# Patient Record
Sex: Male | Born: 1965
Health system: Southern US, Community
[De-identification: ages and names within clinical notes are randomized; demographics above are authoritative.]

## PROBLEM LIST (undated history)

## (undated) DIAGNOSIS — I890 Lymphedema, not elsewhere classified: Secondary | ICD-10-CM

## (undated) DIAGNOSIS — E039 Hypothyroidism, unspecified: Secondary | ICD-10-CM

## (undated) DIAGNOSIS — E079 Disorder of thyroid, unspecified: Secondary | ICD-10-CM

## (undated) HISTORY — DX: Disorder of thyroid, unspecified: E07.9

## (undated) HISTORY — PX: HERNIA REPAIR: SHX51

## (undated) HISTORY — PX: CHOLECYSTECTOMY, LAPAROSCOPIC: SHX56

## (undated) HISTORY — PX: GASTRIC BYPASS: SHX52

## (undated) HISTORY — DX: Morbid (severe) obesity due to excess calories: E66.01

## (undated) HISTORY — DX: Lymphedema, not elsewhere classified: I89.0

## (undated) HISTORY — DX: Hypothyroidism, unspecified: E03.9

---

## 2004-05-05 ENCOUNTER — Ambulatory Visit: Payer: Self-pay | Admitting: Specialist

## 2007-11-16 ENCOUNTER — Ambulatory Visit: Payer: Self-pay | Admitting: Internal Medicine

## 2008-11-12 ENCOUNTER — Ambulatory Visit: Payer: Self-pay | Admitting: Internal Medicine

## 2008-11-19 ENCOUNTER — Ambulatory Visit: Payer: Self-pay | Admitting: Internal Medicine

## 2008-11-28 ENCOUNTER — Ambulatory Visit: Payer: Self-pay | Admitting: Internal Medicine

## 2008-12-03 ENCOUNTER — Ambulatory Visit: Payer: Self-pay | Admitting: Internal Medicine

## 2008-12-05 ENCOUNTER — Ambulatory Visit: Payer: Self-pay | Admitting: Internal Medicine

## 2008-12-10 ENCOUNTER — Ambulatory Visit: Payer: Self-pay | Admitting: Internal Medicine

## 2008-12-12 ENCOUNTER — Ambulatory Visit: Payer: Self-pay | Admitting: Internal Medicine

## 2008-12-17 ENCOUNTER — Ambulatory Visit: Payer: Self-pay | Admitting: Internal Medicine

## 2008-12-19 ENCOUNTER — Ambulatory Visit: Payer: Self-pay | Admitting: Internal Medicine

## 2008-12-23 ENCOUNTER — Ambulatory Visit: Payer: Self-pay | Admitting: Internal Medicine

## 2008-12-26 ENCOUNTER — Ambulatory Visit: Payer: Self-pay | Admitting: Internal Medicine

## 2008-12-30 ENCOUNTER — Encounter: Payer: Self-pay | Admitting: Internal Medicine

## 2009-01-26 ENCOUNTER — Encounter: Payer: Self-pay | Admitting: Internal Medicine

## 2009-02-25 ENCOUNTER — Encounter: Payer: Self-pay | Admitting: Internal Medicine

## 2009-03-28 ENCOUNTER — Encounter: Payer: Self-pay | Admitting: Internal Medicine

## 2009-08-01 IMAGING — CT CT PARANASAL SINUSES W/O CM
1 series · 16 of 30 positions shown, 20 images · non-contrast
Comparison: none

REASON FOR EXAM: right facial pain    pain behind right eye   ear ache
COMMENTS:

PROCEDURE:     CT  - CT SINUSES WITHOUT CONTRAST  - November 16, 2007  [DATE]
RESULT:     CT of the sinuses
INDICATION: Right facial pain
Comparisons: No comparison

[Series 3: sinus · axial · 0.39mm/px · z∈[+462,+614]mm · 16 of 36 slices shown, 20 images]
[im 2/36  brain]
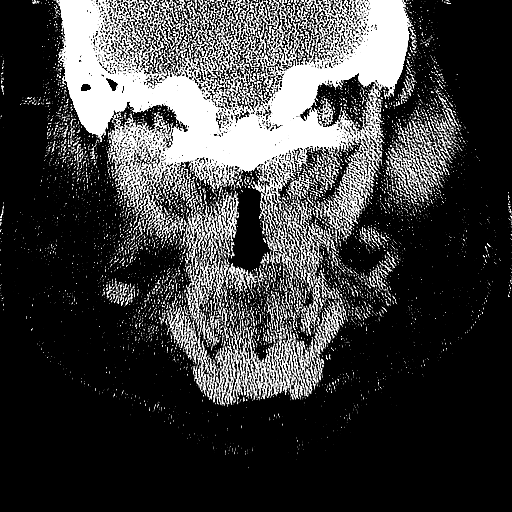
[im 2/36  bone]
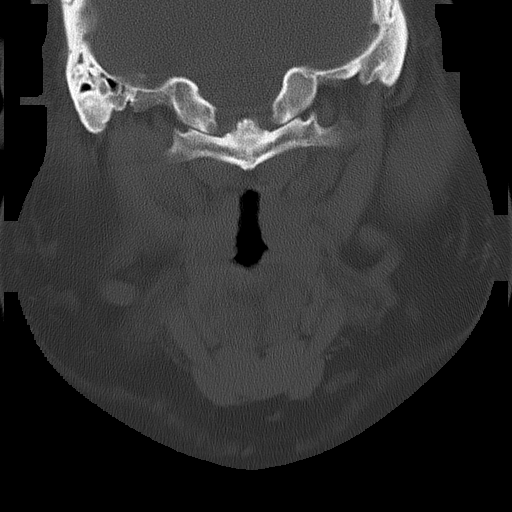
[im 4/36  bone]
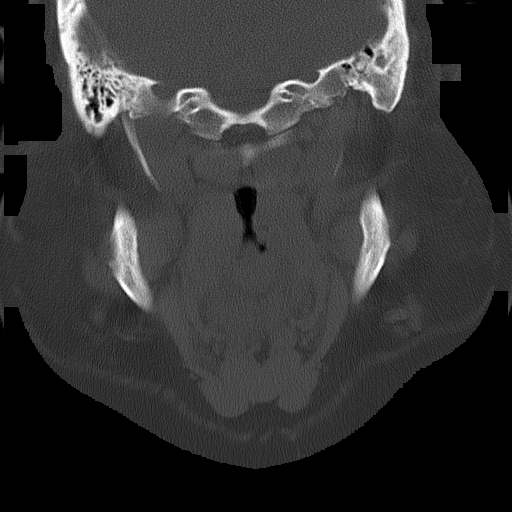
[im 7/36  bone]
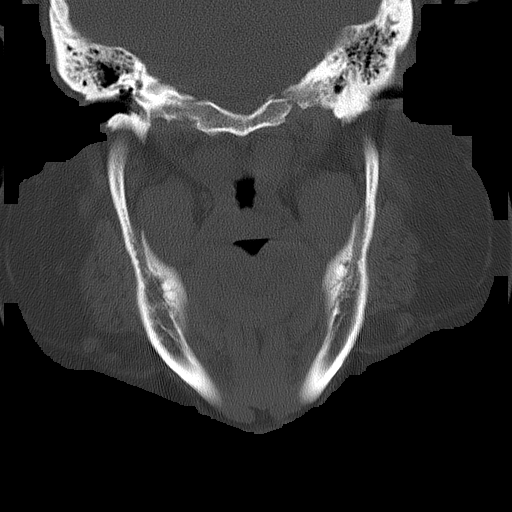
[im 9/36  bone]
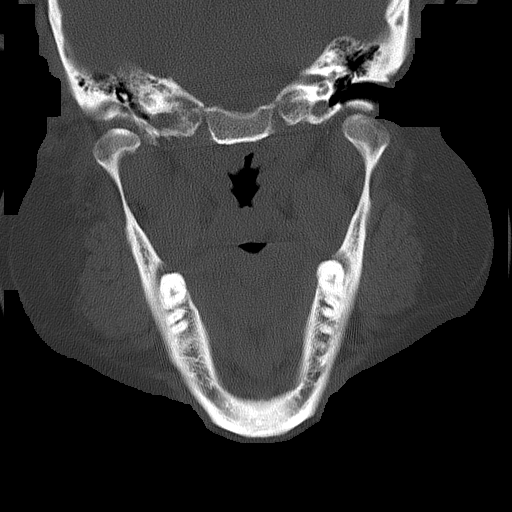
[im 10/36  brain]
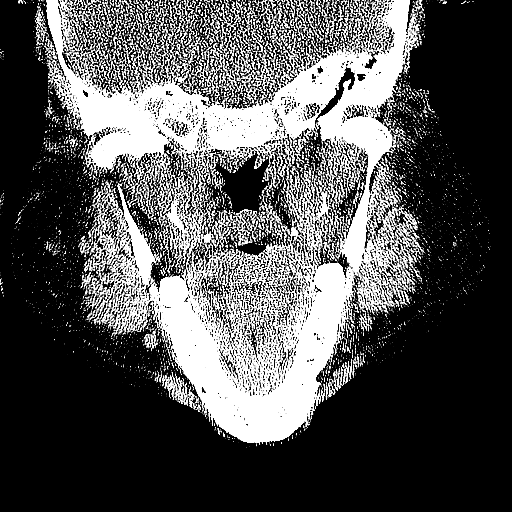
[im 10/36  bone]
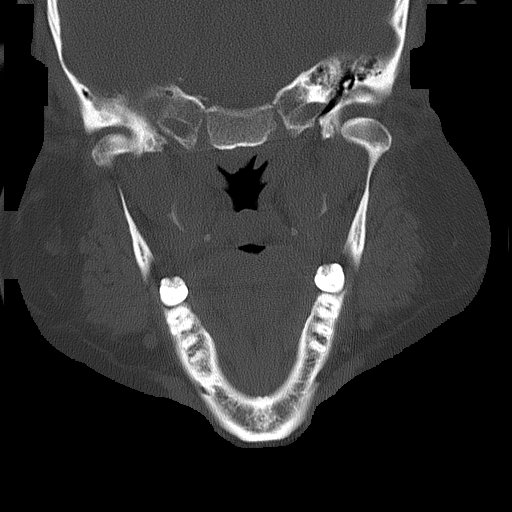
[im 13/36  bone]
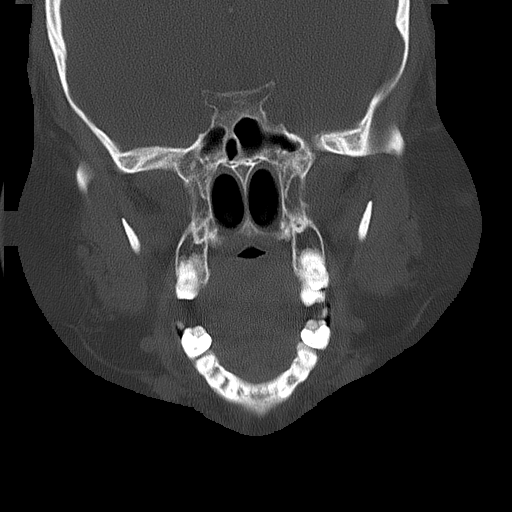
[im 15/36  bone]
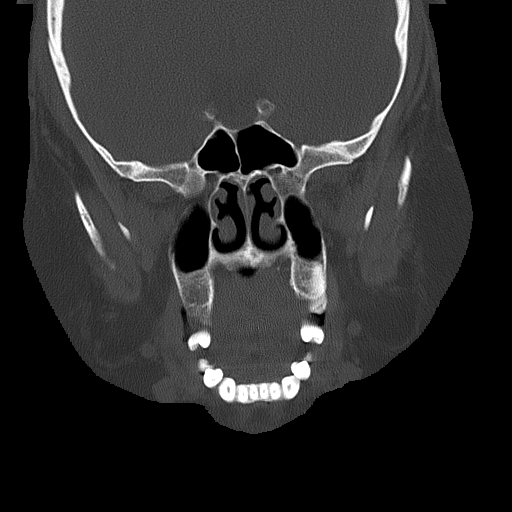
[im 17/36  bone]
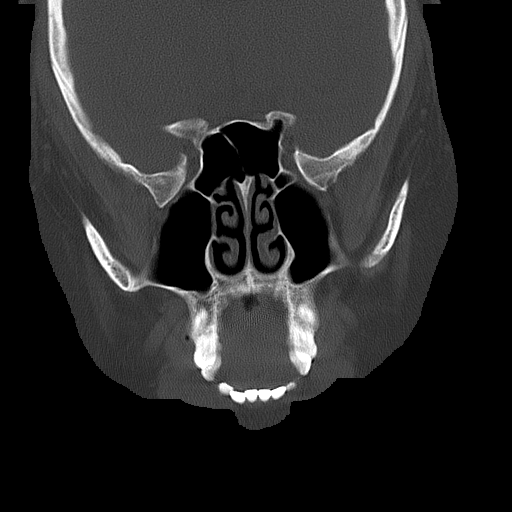
[im 19/36  brain]
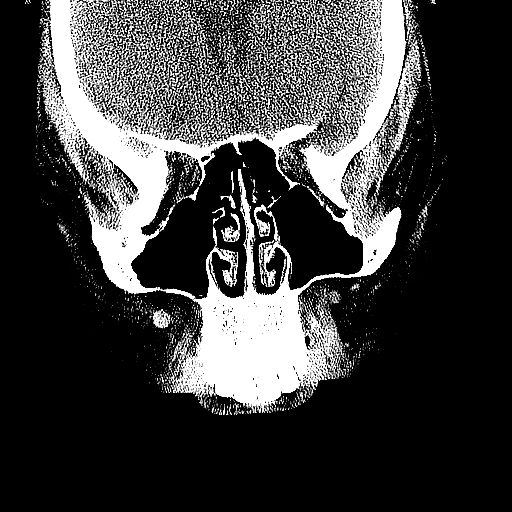
[im 19/36  bone]
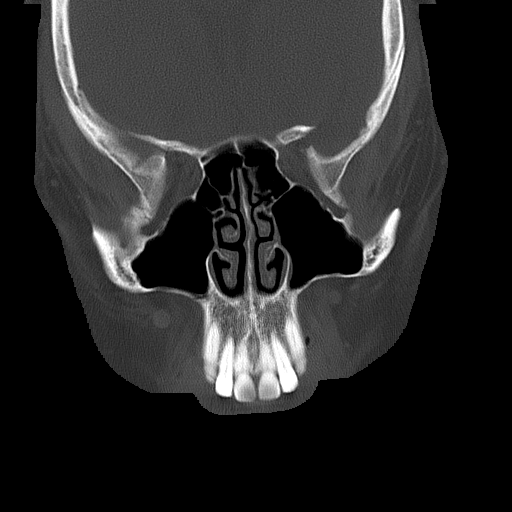
[im 21/36  bone]
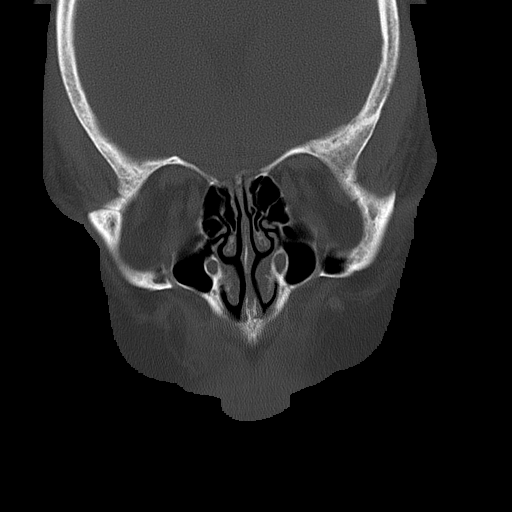
[im 23/36  bone]
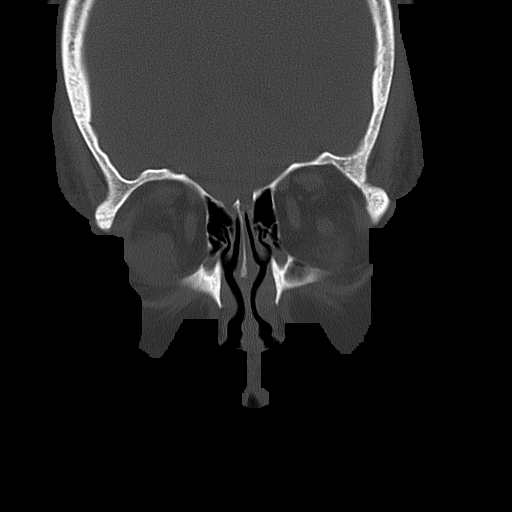
[im 26/36  bone]
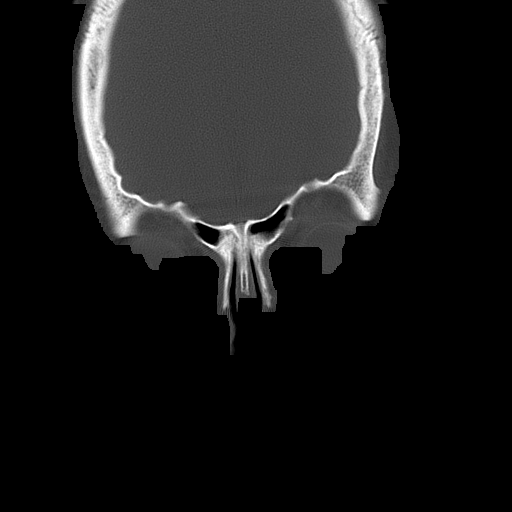
[im 27/36  brain]
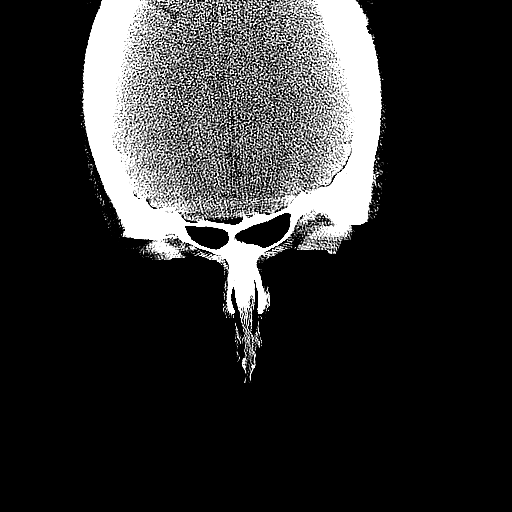
[im 27/36  bone]
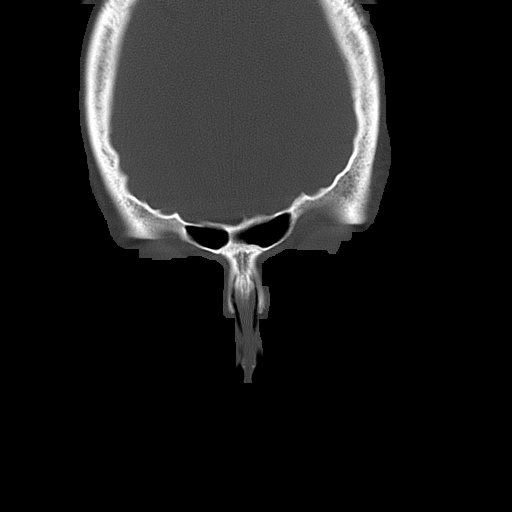
[im 29/36  bone]
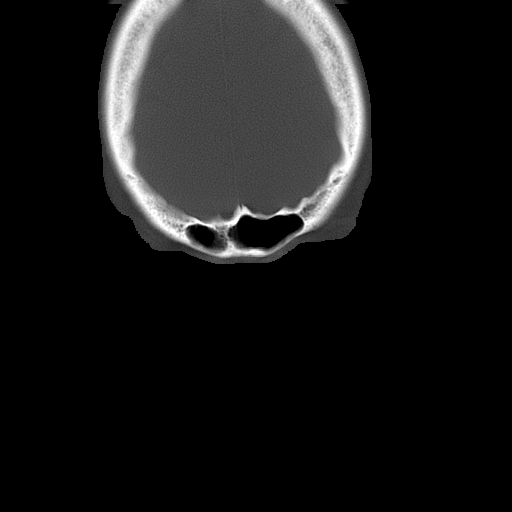
[im 32/36  bone]
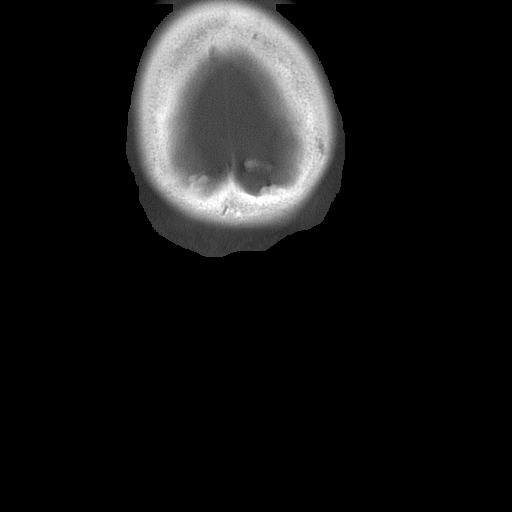
[im 34/36  bone]
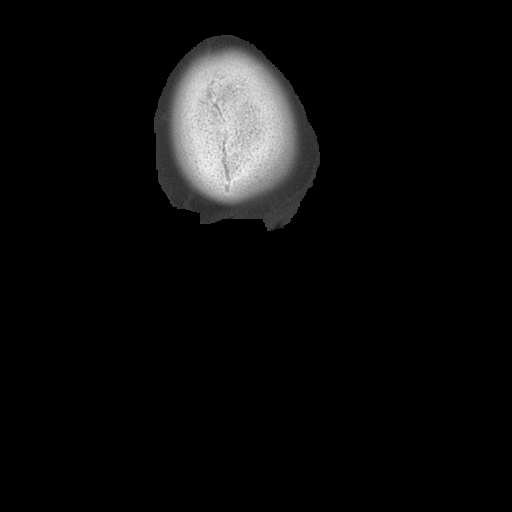

[16 of 30 positions shown; findings below may reference images not displayed]

FINDINGS: The paranasal sinuses including the sphenoidal, ethmoidal, frontal, and
maxillary sinuses, are identified without evidence of disease. No air-fluid
levels are identified. The nasal septum is midline. The OMCs are patent
bilaterally. The frontoethmoid recesses are normal bilaterally. No bony
abnormalities are identified. The visualized portions of the brain and
orbits are unremarkable.
IMPRESSION: No evidence for acute sinus abnormality

## 2012-03-09 ENCOUNTER — Ambulatory Visit: Payer: Self-pay

## 2012-04-09 ENCOUNTER — Ambulatory Visit: Payer: Self-pay | Admitting: Internal Medicine

## 2012-04-10 ENCOUNTER — Ambulatory Visit: Payer: Self-pay | Admitting: Specialist

## 2012-04-10 LAB — CBC WITH DIFFERENTIAL/PLATELET
Basophil #: 0.1 10*3/uL (ref 0.0–0.1)
Basophil %: 1.3 %
Eosinophil %: 4.2 %
HGB: 14.2 g/dL (ref 13.0–18.0)
Lymphocyte %: 31.8 %
MCH: 29.2 pg (ref 26.0–34.0)
MCV: 87 fL (ref 80–100)
Monocyte %: 9.4 %
Neutrophil %: 53.3 %
Platelet: 249 10*3/uL (ref 150–440)
RBC: 4.87 10*6/uL (ref 4.40–5.90)
RDW: 14.5 % (ref 11.5–14.5)

## 2012-04-10 LAB — FOLATE: Folic Acid: 13.2 ng/mL (ref 3.1–100.0)

## 2012-04-10 LAB — COMPREHENSIVE METABOLIC PANEL
Anion Gap: 7 (ref 7–16)
BUN: 22 mg/dL — ABNORMAL HIGH (ref 7–18)
Bilirubin,Total: 0.6 mg/dL (ref 0.2–1.0)
Calcium, Total: 8.9 mg/dL (ref 8.5–10.1)
Co2: 24 mmol/L (ref 21–32)
Creatinine: 1.05 mg/dL (ref 0.60–1.30)
EGFR (Non-African Amer.): >60
Glucose: 91 mg/dL (ref 65–99)
Osmolality: 271 (ref 275–301)
SGOT(AST): 44 U/L — ABNORMAL HIGH (ref 15–37)
SGPT (ALT): 58 U/L (ref 12–78)
Sodium: 134 mmol/L — ABNORMAL LOW (ref 136–145)

## 2012-04-10 LAB — MAGNESIUM: Magnesium: 2 mg/dL

## 2012-04-10 LAB — BILIRUBIN, DIRECT: Bilirubin, Direct: 0.2 mg/dL (ref 0.00–0.20)

## 2012-04-10 LAB — HEMOGLOBIN A1C: Hemoglobin A1C: 5.7 % (ref 4.2–6.3)

## 2012-04-10 LAB — LIPASE, BLOOD: Lipase: 154 U/L (ref 73–393)

## 2012-04-10 LAB — PROTIME-INR: Prothrombin Time: 13.6 secs (ref 11.5–14.7)

## 2012-04-10 LAB — AMYLASE: Amylase: 35 U/L (ref 25–115)

## 2012-04-10 LAB — IRON AND TIBC: Iron Bind.Cap.(Total): 318 ug/dL (ref 250–450)

## 2012-04-10 LAB — PHOSPHORUS: Phosphorus: 2.8 mg/dL (ref 2.5–4.9)

## 2012-05-02 ENCOUNTER — Ambulatory Visit: Payer: Self-pay | Admitting: Specialist

## 2012-05-26 ENCOUNTER — Ambulatory Visit: Payer: Self-pay | Admitting: Specialist

## 2012-11-12 ENCOUNTER — Ambulatory Visit: Payer: Self-pay | Admitting: Specialist

## 2012-11-14 ENCOUNTER — Ambulatory Visit: Payer: Self-pay | Admitting: Specialist

## 2013-05-13 IMAGING — US US EXTREM LOW VENOUS*R*
1 series · 14 of 24 positions shown · non-contrast
Comparison: none

REASON FOR EXAM: CALL REPORT [id number] Rt Leg Pain Swelling Eval DVT
COMMENTS:

PROCEDURE:     US  - US DOPPLER LOW EXTR RIGHT  - April 09, 2012 [DATE]
RESULT:     Comparison: None

[Series 1: us extrem low venous*right* · 0.18mm/px · 14 of 32 slices shown]
[im 1/32]
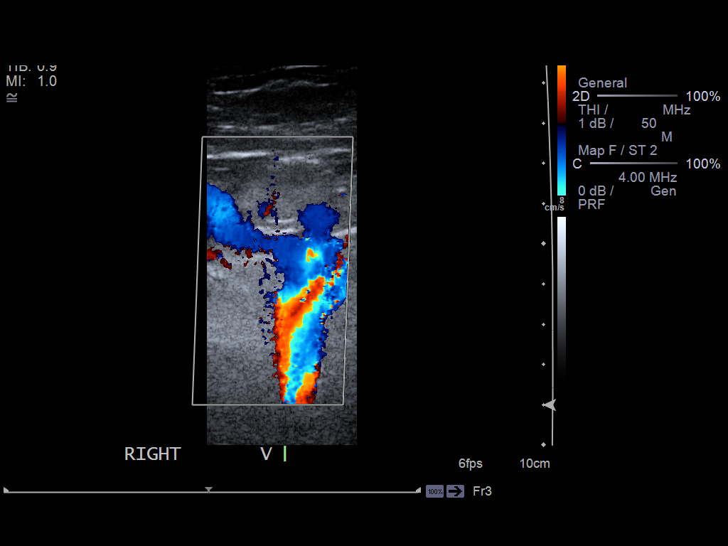
[im 3/32]
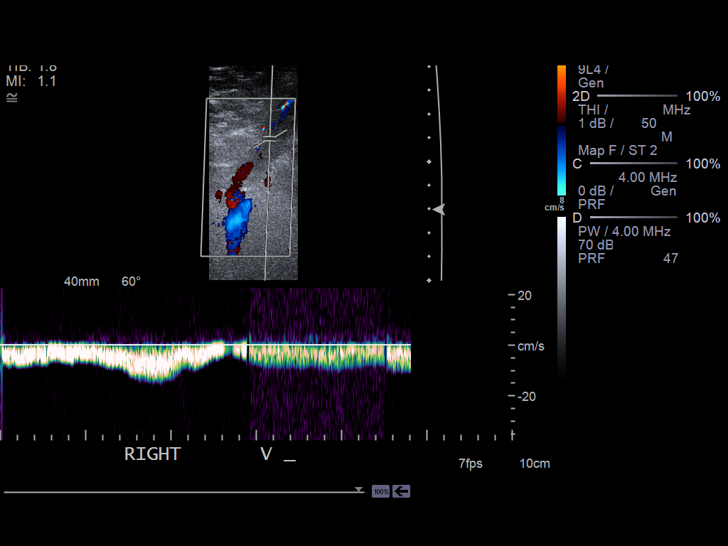
[im 6/32]
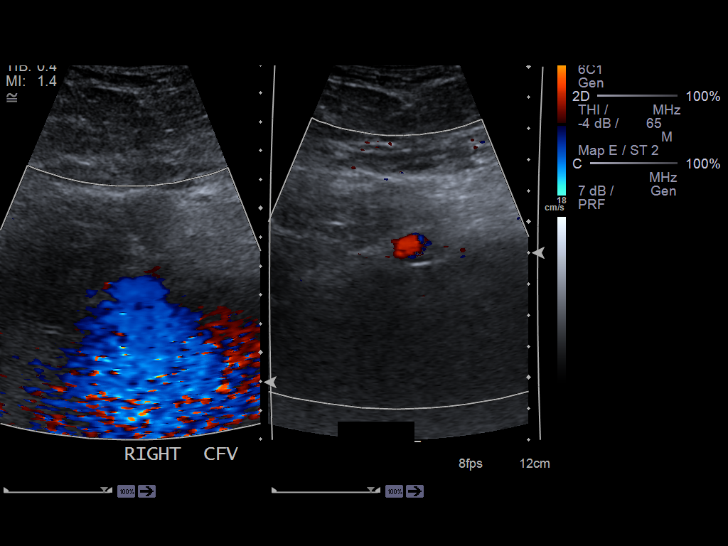
[im 9/32]
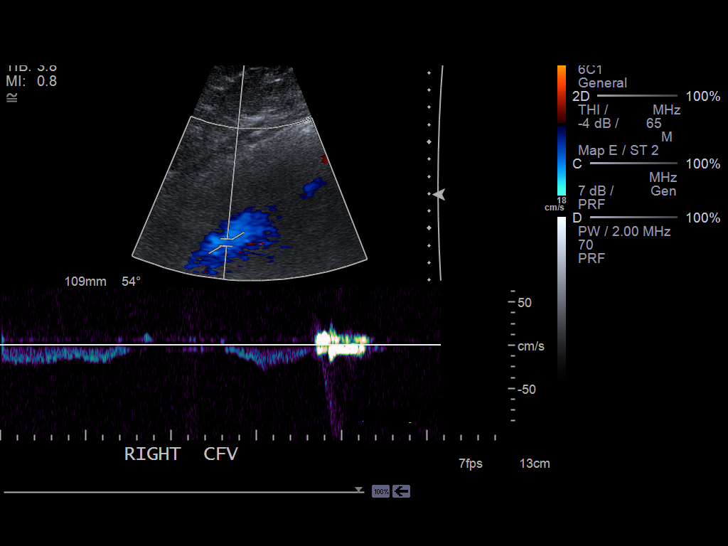
[im 10/32]
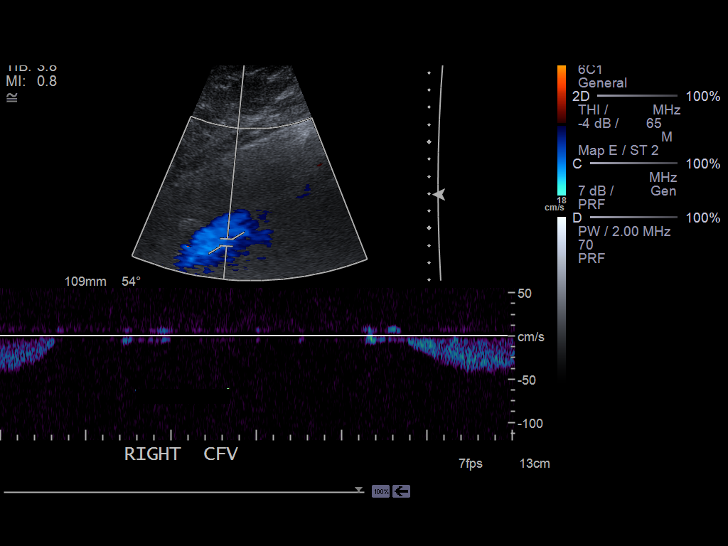
[im 13/32]
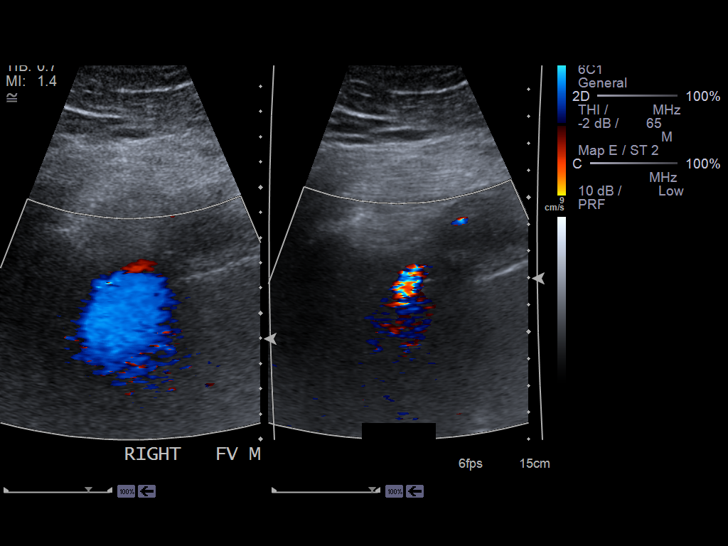
[im 15/32]
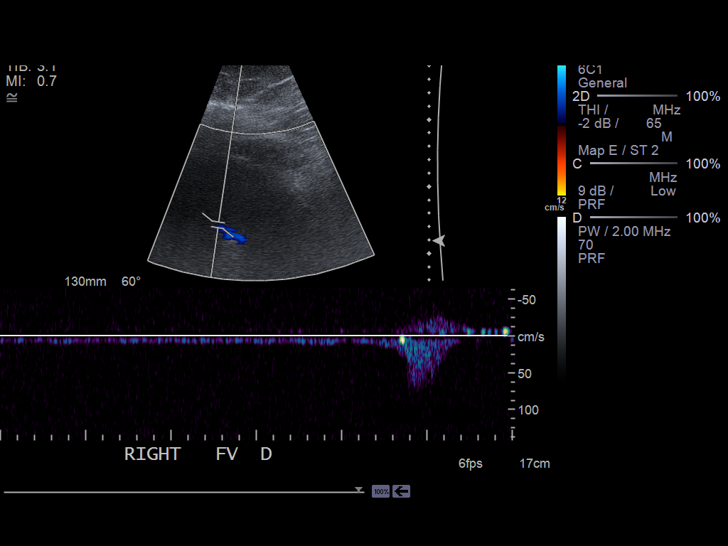
[im 17/32]
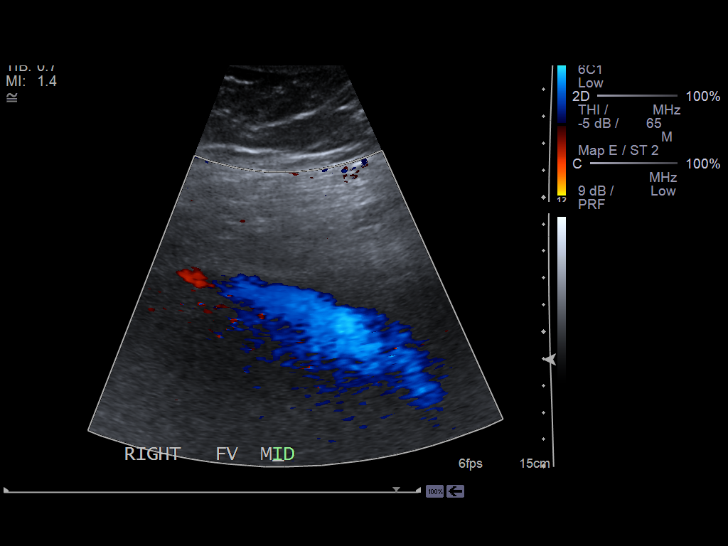
[im 19/32]
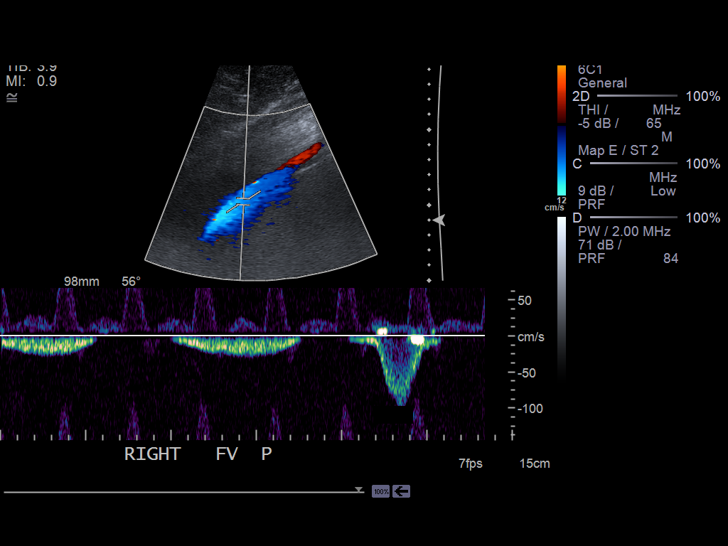
[im 22/32]
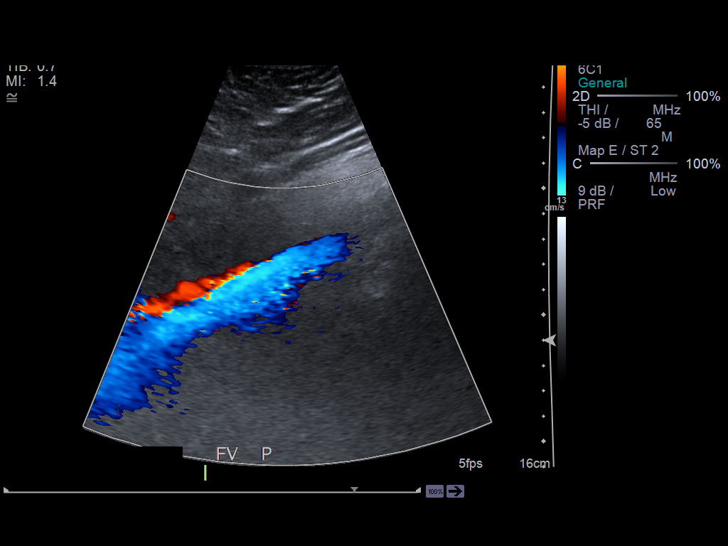
[im 25/32]
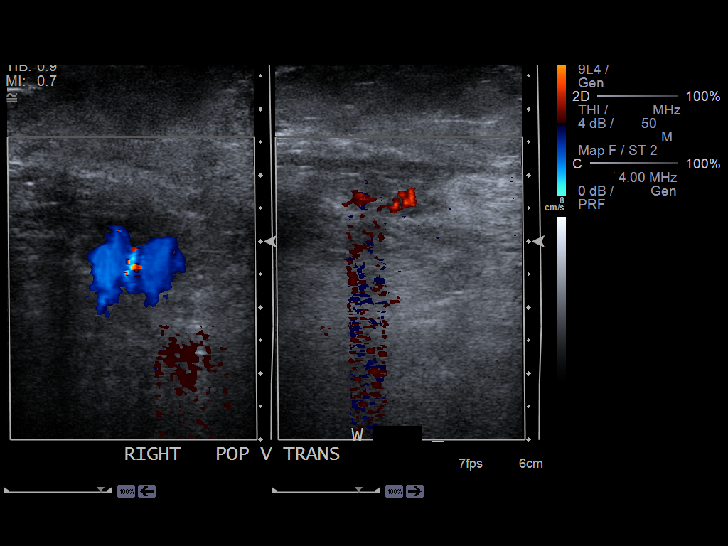
[im 26/32]
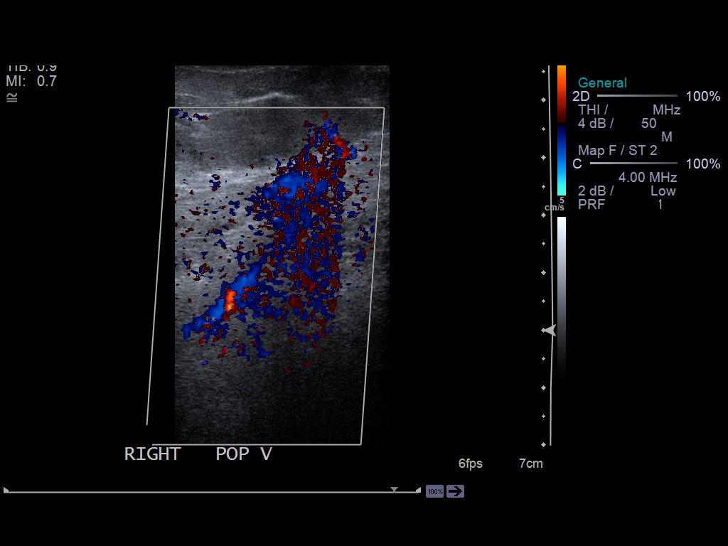
[im 29/32]
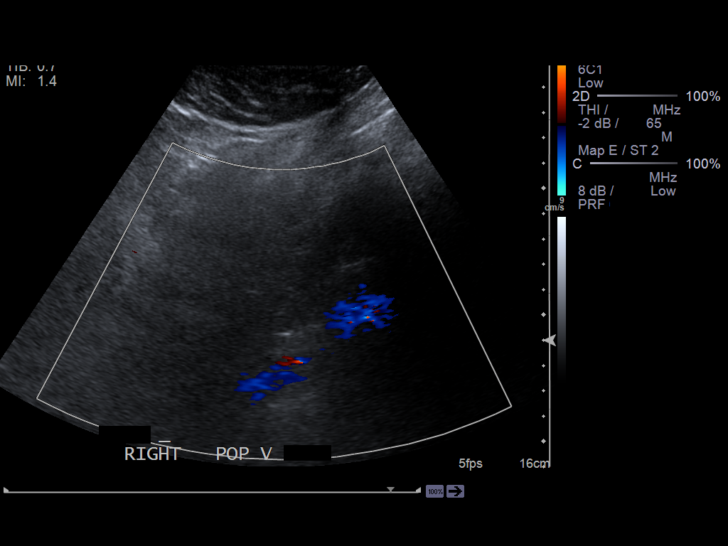
[im 32/32]
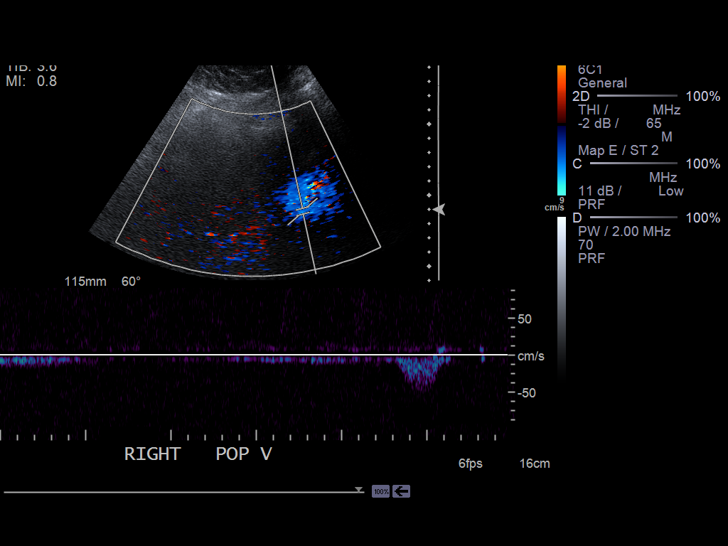

[14 of 24 positions shown; findings below may reference images not displayed]

FINDINGS: Multiple longitudinal and transverse gray-scale as well as color
and spectral Doppler images of the right lower extremity veins were obtained
from the common femoral veins through the popliteal veins. Examination is
limited secondary to patient body habitus and edema.

The right common femoral, greater saphenous, femoral, popliteal veins, and
venous trifurcation are patent, demonstrating normal color-flow and
compressibility. No intraluminal thrombus is identified.There is normal
respiratory variation and augmentation demonstrated at all vein levels.
IMPRESSION: Examination is limited secondary to patient body habitus and edema. No
evidence of DVT in the right lower extremity.

[REDACTED]

## 2013-11-18 ENCOUNTER — Other Ambulatory Visit: Payer: Self-pay | Admitting: Specialist

## 2013-11-18 LAB — COMPREHENSIVE METABOLIC PANEL
ANION GAP: 8 (ref 7–16)
AST: 37 U/L (ref 15–37)
Albumin: 3.7 g/dL (ref 3.4–5.0)
Alkaline Phosphatase: 52 U/L
BILIRUBIN TOTAL: 0.6 mg/dL (ref 0.2–1.0)
BUN: 26 mg/dL — AB (ref 7–18)
CALCIUM: 8.4 mg/dL — AB (ref 8.5–10.1)
CO2: 26 mmol/L (ref 21–32)
CREATININE: 0.79 mg/dL (ref 0.60–1.30)
Chloride: 108 mmol/L — ABNORMAL HIGH (ref 98–107)
EGFR (African American): >60
Glucose: 87 mg/dL (ref 65–99)
OSMOLALITY: 287 (ref 275–301)
Potassium: 4.2 mmol/L (ref 3.5–5.1)
SGPT (ALT): 37 U/L
SODIUM: 142 mmol/L (ref 136–145)
TOTAL PROTEIN: 7 g/dL (ref 6.4–8.2)

## 2013-11-18 LAB — CBC WITH DIFFERENTIAL/PLATELET
BASOS PCT: 0.9 %
Basophil #: 0.1 10*3/uL (ref 0.0–0.1)
EOS PCT: 2.8 %
Eosinophil #: 0.2 10*3/uL (ref 0.0–0.7)
HCT: 45.9 % (ref 40.0–52.0)
HGB: 15.2 g/dL (ref 13.0–18.0)
LYMPHS ABS: 2.3 10*3/uL (ref 1.0–3.6)
Lymphocyte %: 37 %
MCH: 30.4 pg (ref 26.0–34.0)
MCHC: 33.1 g/dL (ref 32.0–36.0)
MCV: 92 fL (ref 80–100)
MONOS PCT: 9.1 %
Monocyte #: 0.6 x10 3/mm (ref 0.2–1.0)
NEUTROS ABS: 3.1 10*3/uL (ref 1.4–6.5)
Neutrophil %: 50.2 %
Platelet: 200 10*3/uL (ref 150–440)
RBC: 5 10*6/uL (ref 4.40–5.90)
RDW: 13.3 % (ref 11.5–14.5)
WBC: 6.2 10*3/uL (ref 3.8–10.6)

## 2013-11-18 LAB — PHOSPHORUS: Phosphorus: 3.4 mg/dL (ref 2.5–4.9)

## 2013-11-18 LAB — MAGNESIUM: Magnesium: 2 mg/dL

## 2013-11-18 LAB — IRON: Iron: 66 ug/dL (ref 65–175)

## 2013-11-18 LAB — AMYLASE: AMYLASE: 50 U/L (ref 25–115)

## 2013-11-18 LAB — FERRITIN: Ferritin (ARMC): 354 ng/mL (ref 8–388)

## 2013-11-18 LAB — FOLATE: FOLIC ACID: 38.9 ng/mL (ref 3.1–100.0)

## 2013-11-23 IMAGING — CR DG KNEE COMPLETE 4+V*R*
1 series · 4 of 4 positions shown · non-contrast
Comparison: none

REASON FOR EXAM: knee pain
COMMENTS:

PROCEDURE:     DXR - DXR KNEE RT COMP WITH OBLIQUES  - March 09, 2012  [DATE]
RESULT:     Patellofemoral/ femoral-tibial degenerative change. No acute
abnormalities identified.

[Series 1: t knee obl right · 0.14mm/px · 4 of 4 slices shown]
[im 1/4]
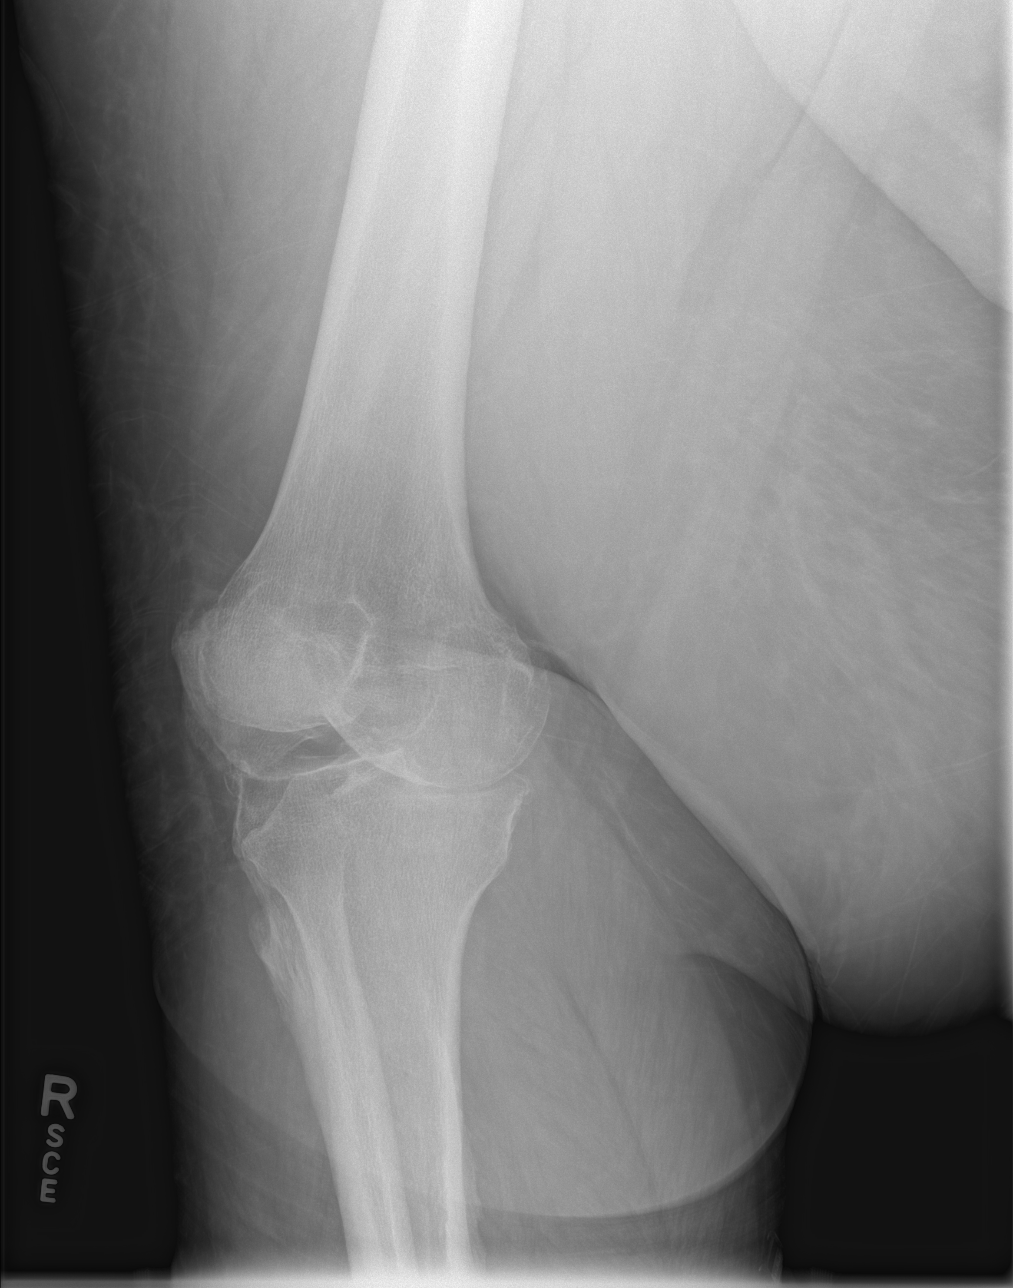
[im 2/4]
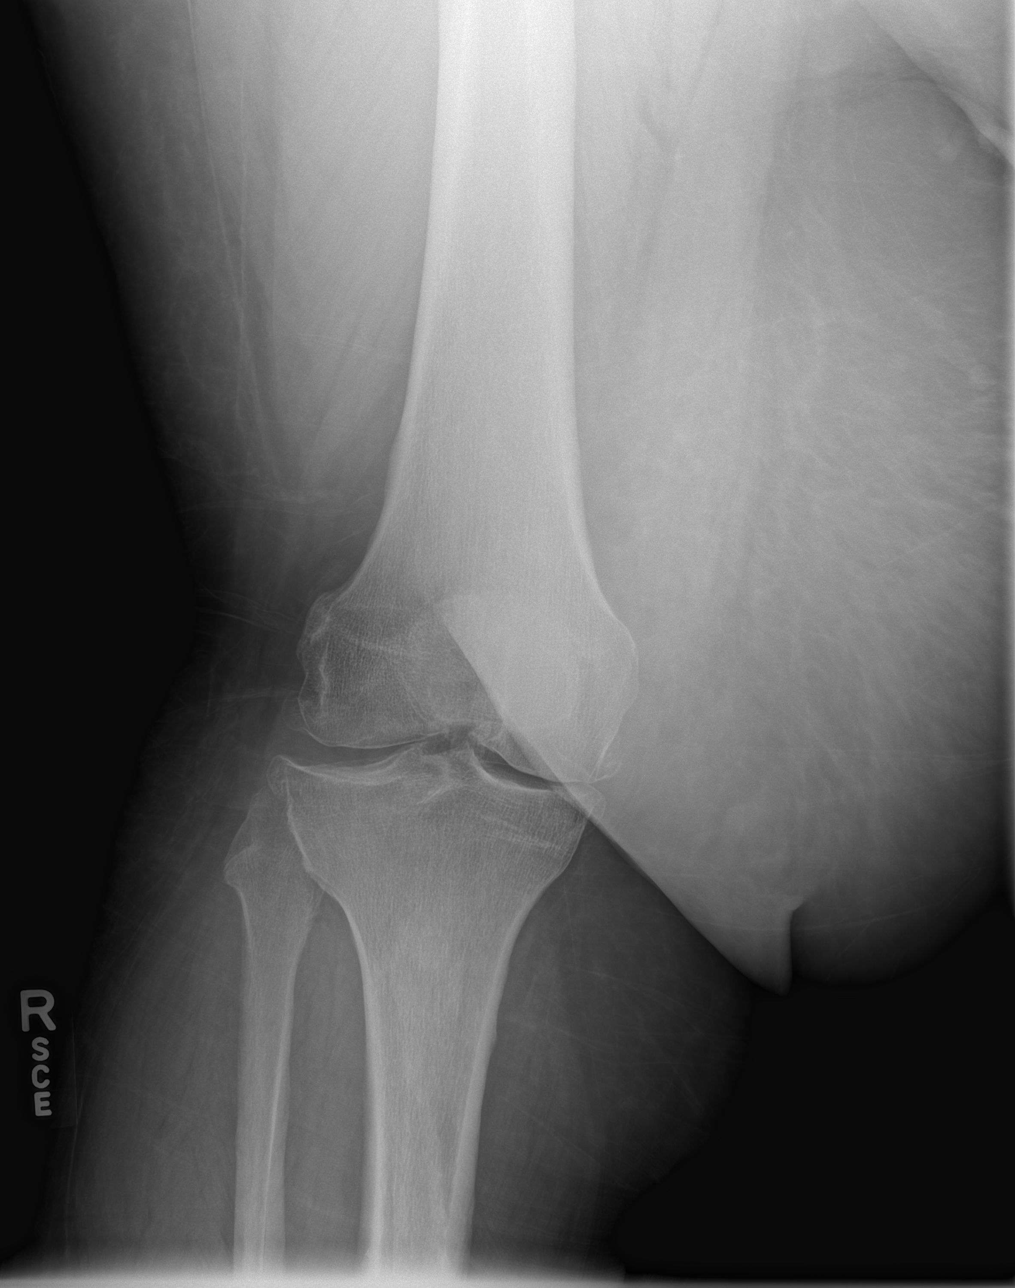
[im 3/4]
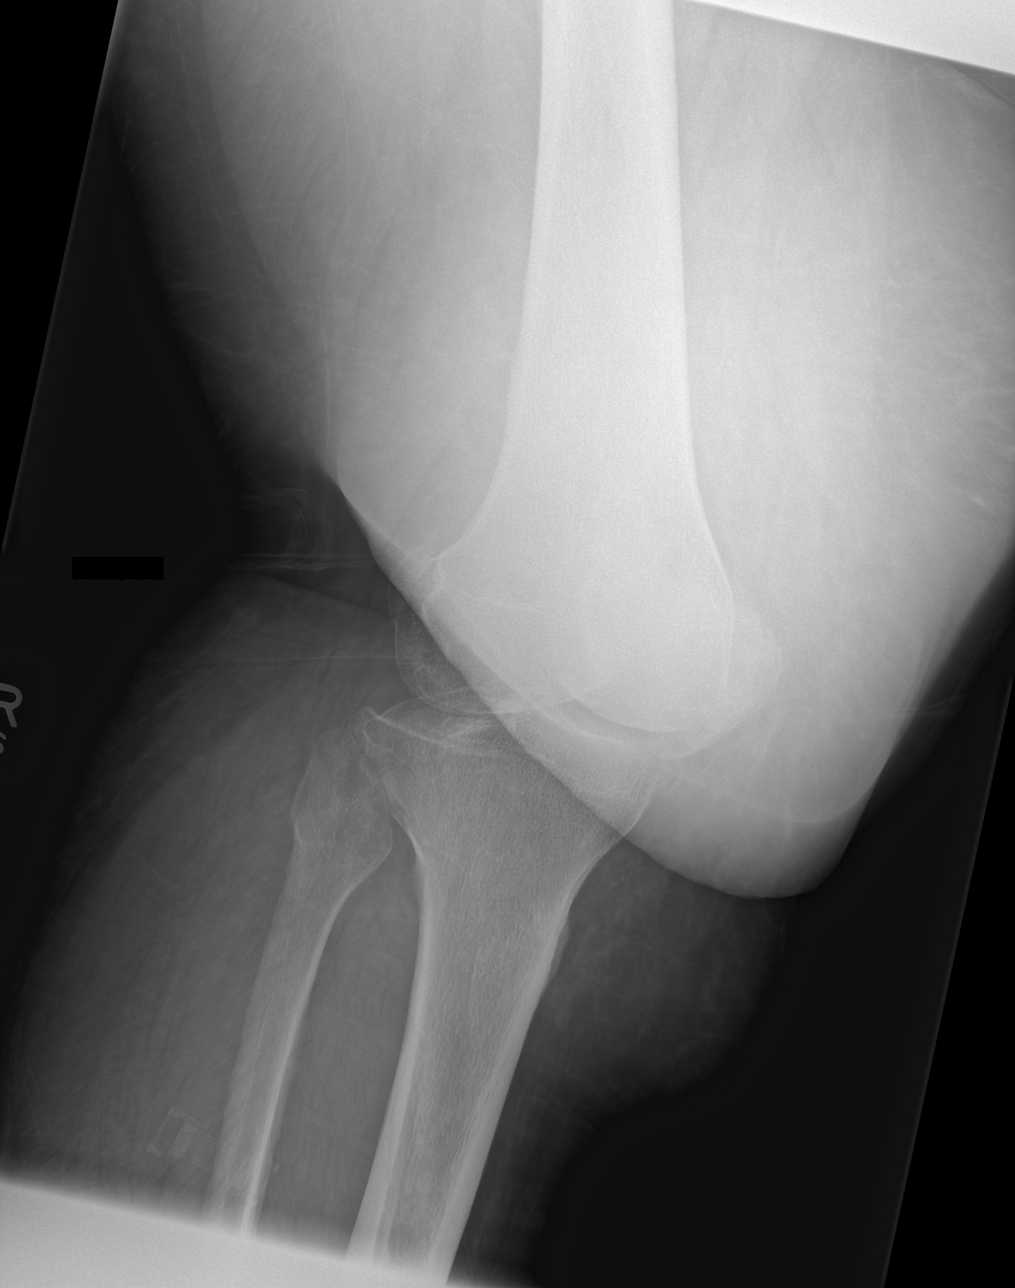
[im 4/4]
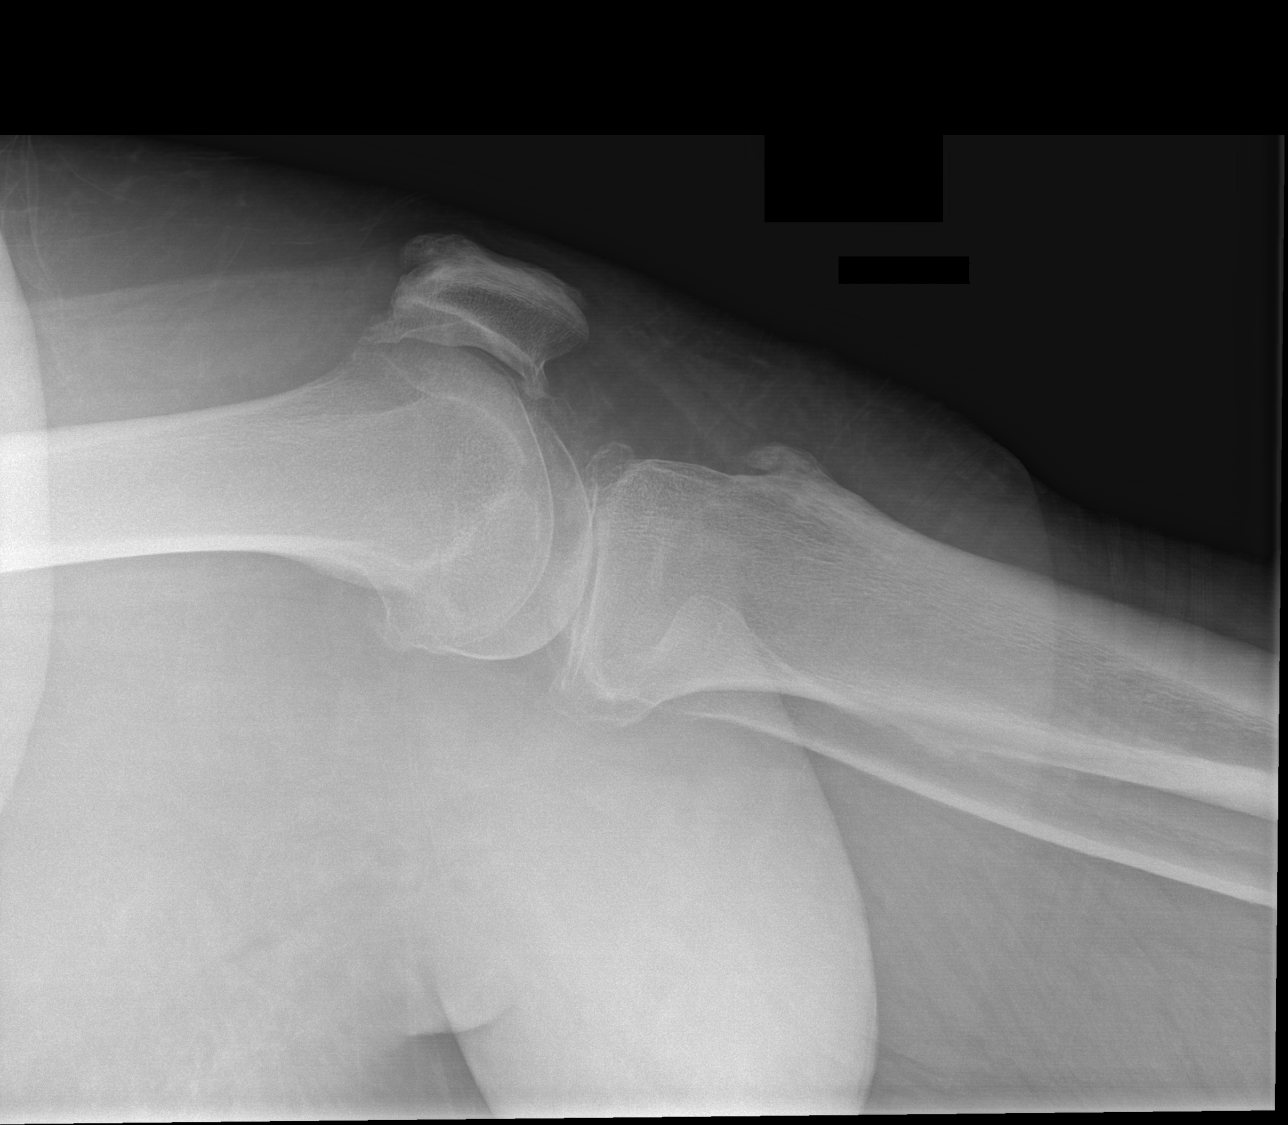

[4 of 4 positions shown; findings below may reference images not displayed]

IMPRESSION: Prominent degenerative change. No acute abnormality.

## 2014-07-31 IMAGING — US US EXTREM LOW VENOUS*R*
1 series · 14 of 22 positions shown · non-contrast
Comparison: none

REASON FOR EXAM: Call report  [id number]    status post surgery  pain
pressure  R leg  eval f...
COMMENTS:

PROCEDURE:     US  - US DOPPLER LOW EXTR RIGHT  - November 14, 2012  [DATE]
RESULT:     Technique: Gray scale, Duplex color  flow doppler, and spectral
waveform imaging was performed of the deep venous structures of the RIGHT
lower extremity.

[Series 1: us extrem low venous*right* · 0.17mm/px · 14 of 22 slices shown]
[im 1/22]
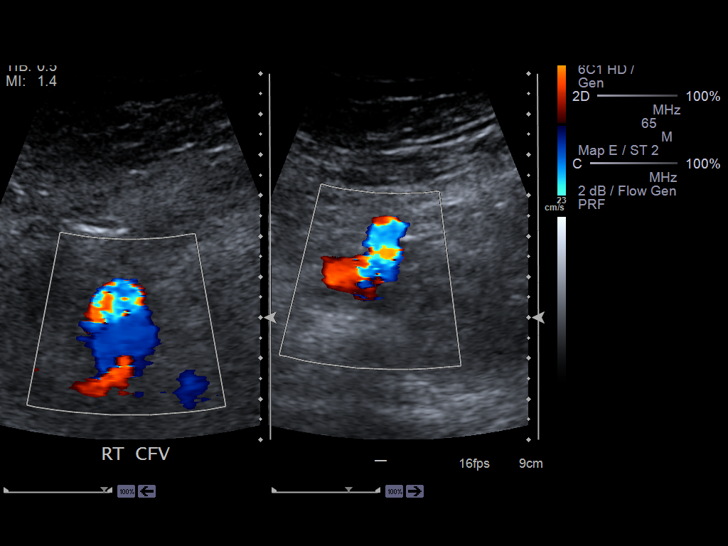
[im 3/22]
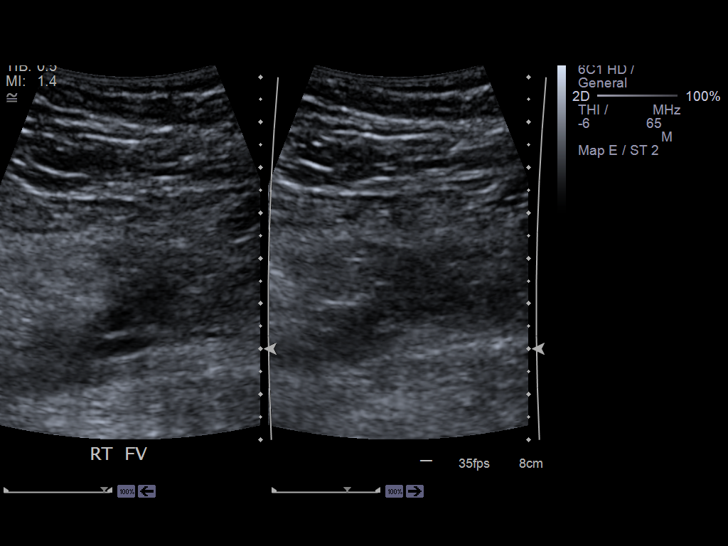
[im 4/22]
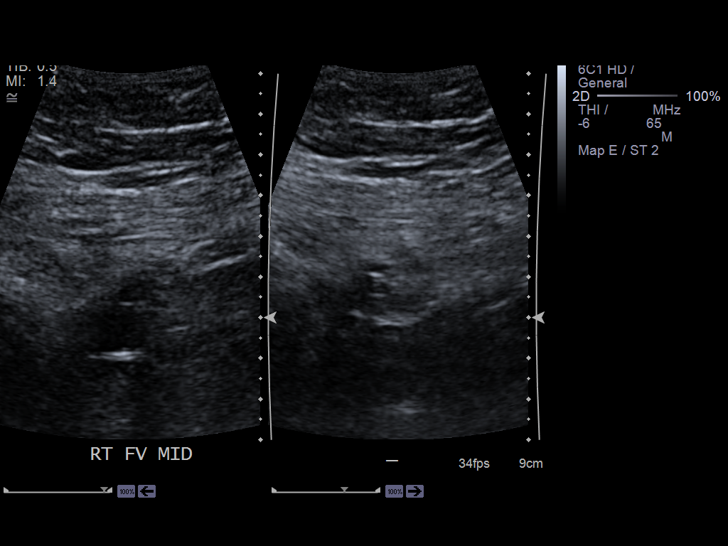
[im 6/22]
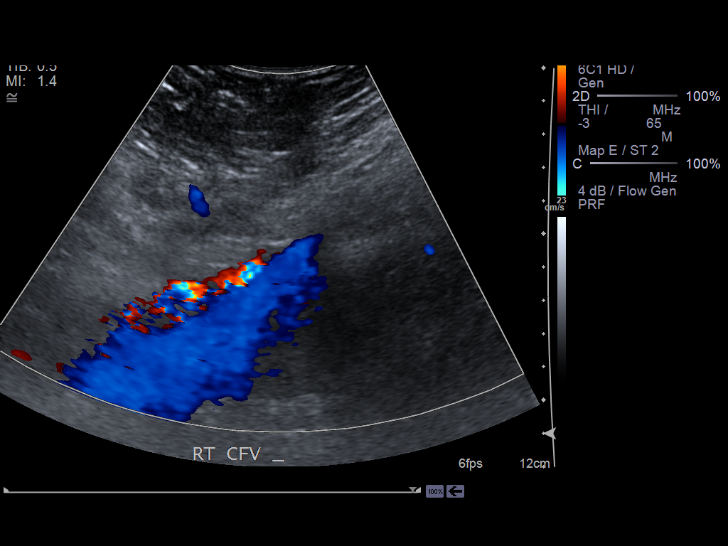
[im 8/22]
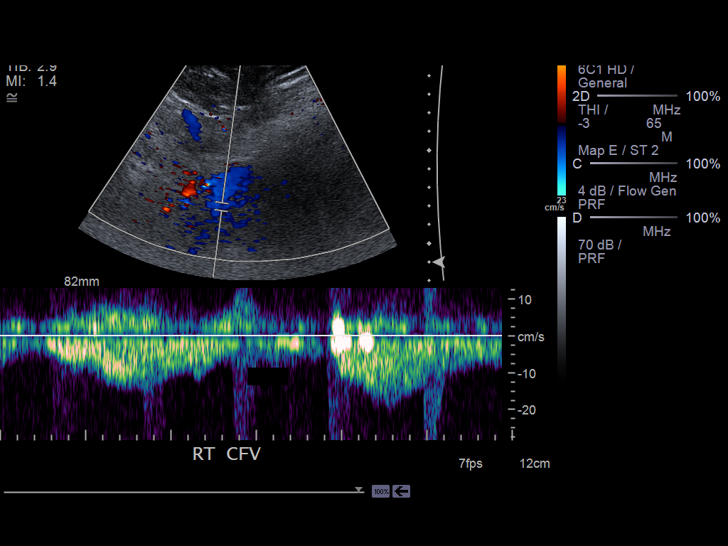
[im 9/22]
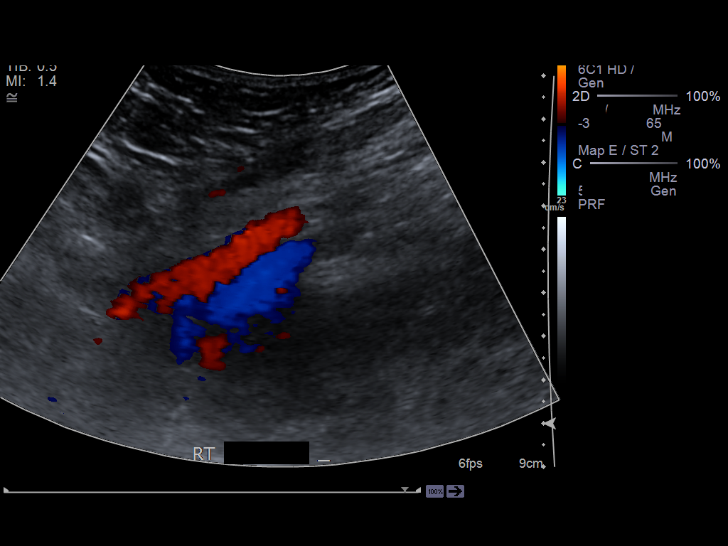
[im 11/22]
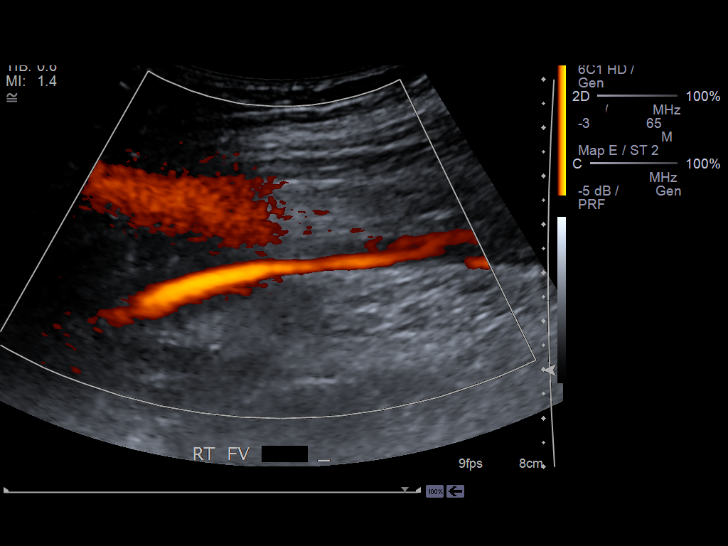
[im 12/22]
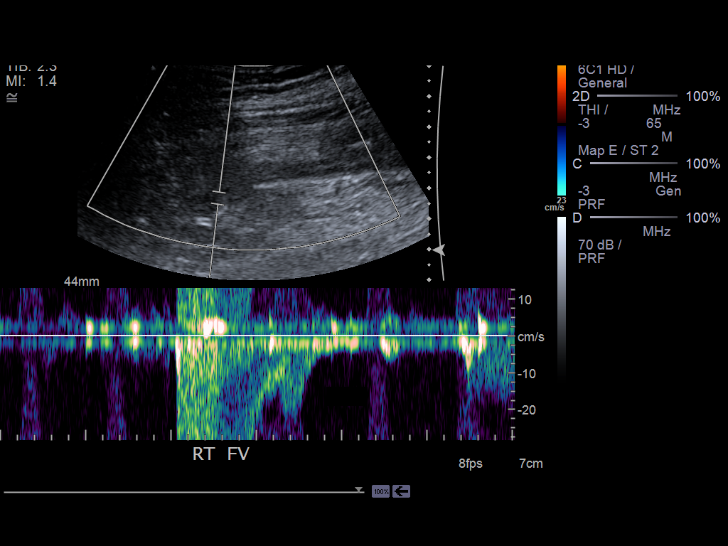
[im 14/22]
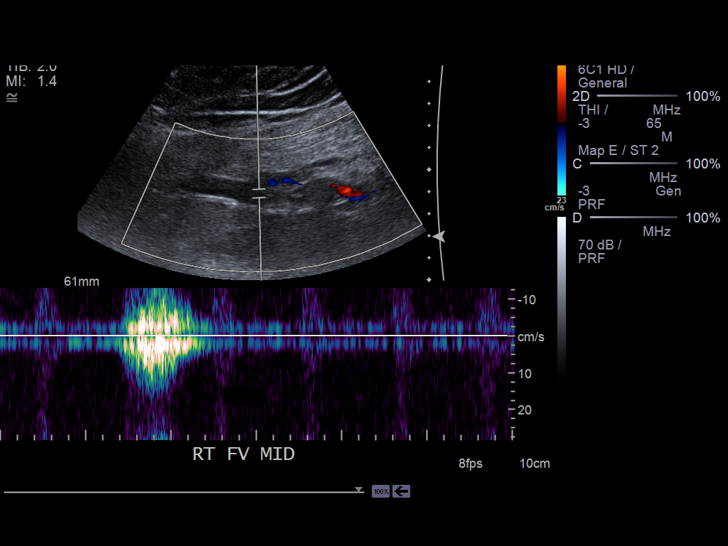
[im 15/22]
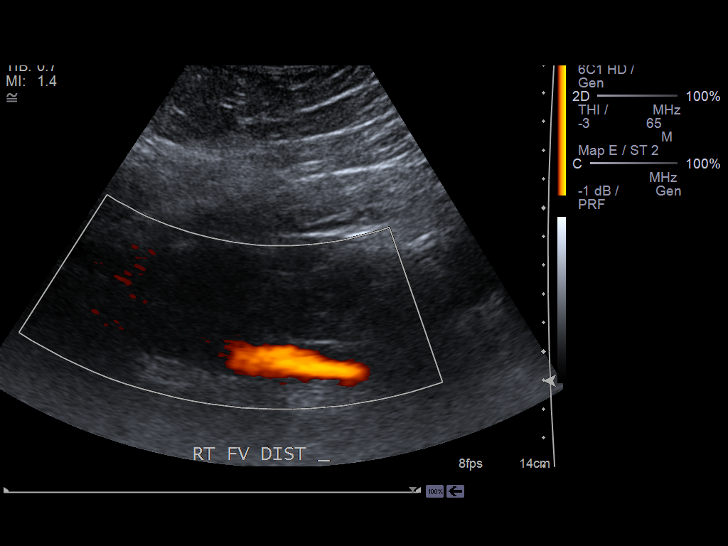
[im 17/22]
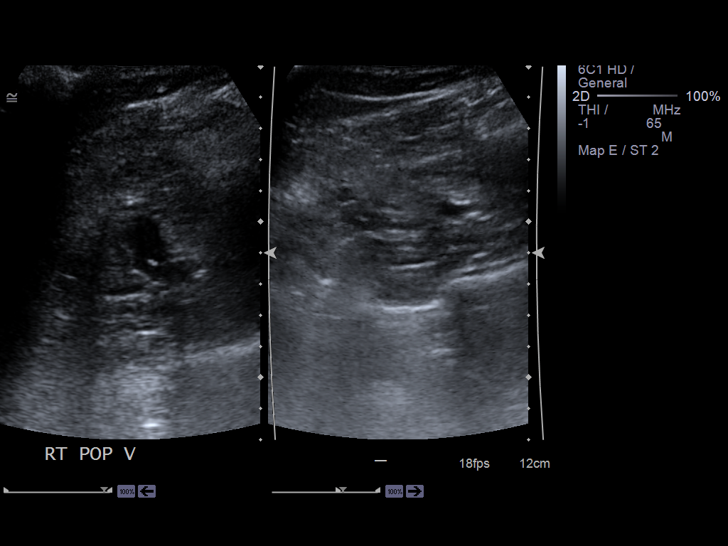
[im 19/22]
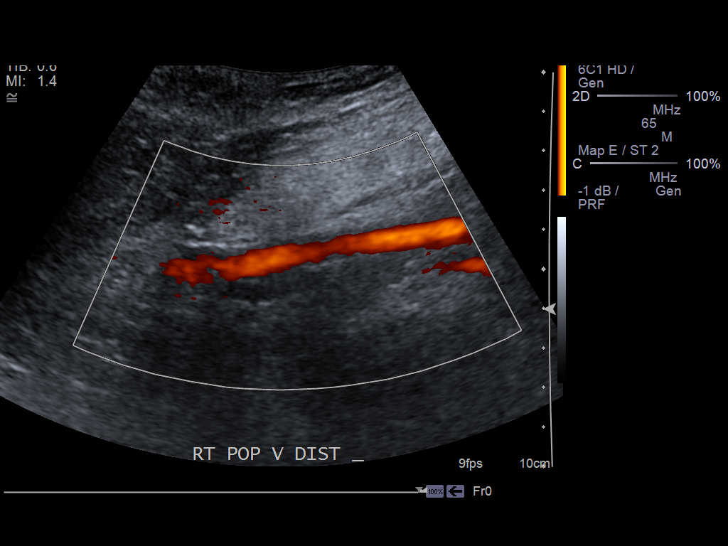
[im 20/22]
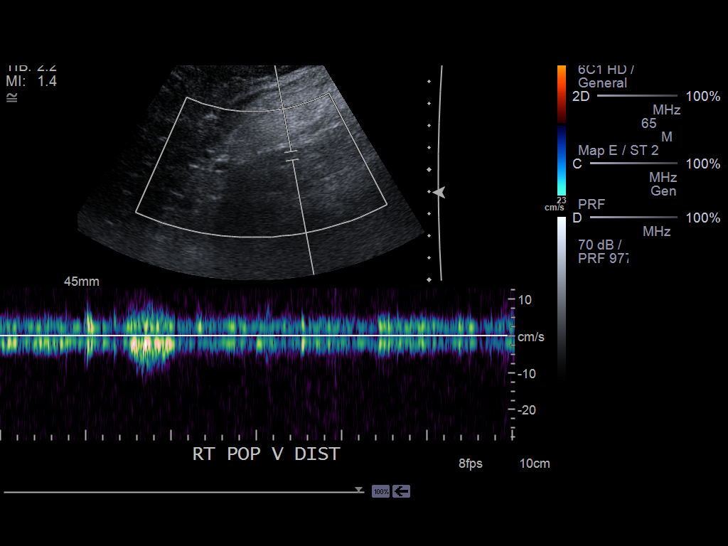
[im 22/22]
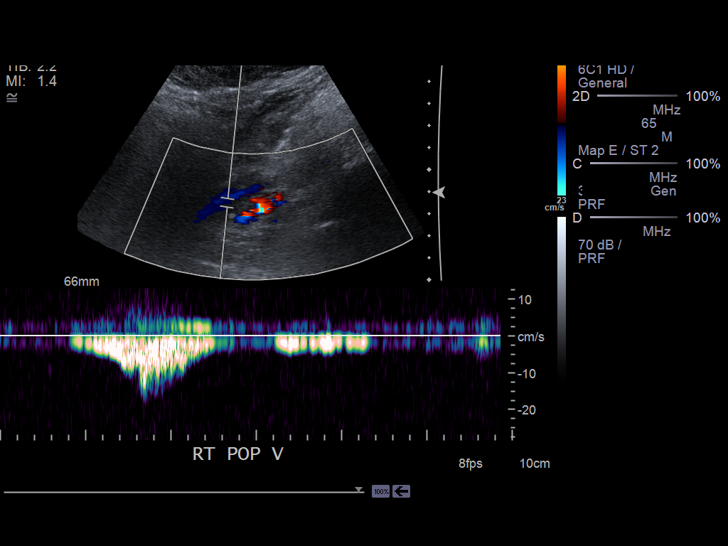

[14 of 22 positions shown; findings below may reference images not displayed]

FINDINGS: There is no evidence of increased echogenicity,
non-compressibility, abnormal waveform, abnormal grayscale, nor abnormal
color flow with in the interrogated deep venous structures of the RIGHT
lower extremity. There is appropriate response to Valsalva and augmentation
within the interrogated vessels.
IMPRESSION: 1. No sonographic evidence of a deep venous thrombus within the interrogated
vessels of the RIGHT lower extremity.

## 2015-12-30 DIAGNOSIS — Z9884 Bariatric surgery status: Secondary | ICD-10-CM | POA: Diagnosis not present

## 2015-12-30 DIAGNOSIS — E039 Hypothyroidism, unspecified: Secondary | ICD-10-CM | POA: Diagnosis not present

## 2015-12-30 DIAGNOSIS — I10 Essential (primary) hypertension: Secondary | ICD-10-CM | POA: Diagnosis not present

## 2015-12-30 DIAGNOSIS — I872 Venous insufficiency (chronic) (peripheral): Secondary | ICD-10-CM | POA: Diagnosis not present

## 2016-01-25 DIAGNOSIS — H9202 Otalgia, left ear: Secondary | ICD-10-CM | POA: Diagnosis not present

## 2016-01-25 DIAGNOSIS — M26602 Left temporomandibular joint disorder, unspecified: Secondary | ICD-10-CM | POA: Diagnosis not present

## 2016-02-19 DIAGNOSIS — Z0001 Encounter for general adult medical examination with abnormal findings: Secondary | ICD-10-CM | POA: Diagnosis not present

## 2016-02-19 DIAGNOSIS — E782 Mixed hyperlipidemia: Secondary | ICD-10-CM | POA: Diagnosis not present

## 2016-02-19 DIAGNOSIS — D649 Anemia, unspecified: Secondary | ICD-10-CM | POA: Diagnosis not present

## 2016-02-19 DIAGNOSIS — E039 Hypothyroidism, unspecified: Secondary | ICD-10-CM | POA: Diagnosis not present

## 2016-03-02 DIAGNOSIS — M170 Bilateral primary osteoarthritis of knee: Secondary | ICD-10-CM | POA: Diagnosis not present

## 2016-05-23 DIAGNOSIS — I890 Lymphedema, not elsewhere classified: Secondary | ICD-10-CM | POA: Diagnosis not present

## 2016-05-23 DIAGNOSIS — E782 Mixed hyperlipidemia: Secondary | ICD-10-CM | POA: Diagnosis not present

## 2016-05-23 DIAGNOSIS — E039 Hypothyroidism, unspecified: Secondary | ICD-10-CM | POA: Diagnosis not present

## 2016-05-23 DIAGNOSIS — Z0001 Encounter for general adult medical examination with abnormal findings: Secondary | ICD-10-CM | POA: Diagnosis not present

## 2016-09-25 DIAGNOSIS — T1592XA Foreign body on external eye, part unspecified, left eye, initial encounter: Secondary | ICD-10-CM | POA: Diagnosis not present

## 2016-09-25 DIAGNOSIS — H109 Unspecified conjunctivitis: Secondary | ICD-10-CM | POA: Diagnosis not present

## 2016-09-26 DIAGNOSIS — H1045 Other chronic allergic conjunctivitis: Secondary | ICD-10-CM | POA: Diagnosis not present

## 2017-02-13 DIAGNOSIS — L03115 Cellulitis of right lower limb: Secondary | ICD-10-CM | POA: Diagnosis not present

## 2017-02-13 DIAGNOSIS — E039 Hypothyroidism, unspecified: Secondary | ICD-10-CM | POA: Diagnosis not present

## 2017-04-03 ENCOUNTER — Other Ambulatory Visit: Payer: Self-pay | Admitting: Internal Medicine

## 2017-06-26 ENCOUNTER — Encounter: Payer: Self-pay | Admitting: Nurse Practitioner

## 2017-06-26 ENCOUNTER — Ambulatory Visit: Payer: BLUE CROSS/BLUE SHIELD | Admitting: Nurse Practitioner

## 2017-06-26 VITALS — BP 126/76 | HR 88 | Resp 16 | Ht 68.0 in | Wt 294.2 lb

## 2017-06-26 DIAGNOSIS — R197 Diarrhea, unspecified: Secondary | ICD-10-CM

## 2017-06-26 DIAGNOSIS — E860 Dehydration: Secondary | ICD-10-CM | POA: Insufficient documentation

## 2017-06-26 MED ORDER — CIPROFLOXACIN HCL 500 MG PO TABS: 500.0000 mg | ORAL_TABLET | Freq: Two times a day (BID) | ORAL | 0 refills | Status: DC

## 2017-06-26 MED ORDER — METRONIDAZOLE 500 MG PO TABS: 500.0000 mg | ORAL_TABLET | Freq: Two times a day (BID) | ORAL | 0 refills | Status: DC

## 2017-06-26 MED ORDER — DIPHENOXYLATE-ATROPINE 2.5-0.025 MG PO TABS: 1.0000 | ORAL_TABLET | Freq: Three times a day (TID) | ORAL | 0 refills | Status: DC | PRN

## 2017-06-26 NOTE — Progress Notes (Signed)
[facility] [address] [place], [place] [address]  Internal MEDICINE  Office Visit Note  Patient Name: Isaiah RouteDanny R Douglas  [id number]  [id number]  Date of Service: 06/26/2017  Chief Complaint  Patient presents with  . Diarrhea    . every 2hr having to go all day     The patient is here for sick visit. He is having significant diarrhea. Started Thursday evening. Was unable to work on Friday, as he was up frequently due to diarrhea. Does have some abdominal cramping. Having episodes about every 2 hours. Stools are described as nearly water. Nothing solid in them at all. The patient is able to eat a little. Has no fever. No nausea or vomiting. He has no sick contacts that he is aware of. Has taken OTC pepto bismol without reliev.   Pt is here for a sick visit.     Current Medication:  Outpatient Encounter Medications as of 06/26/2017  Medication Sig  . aspirin EC 81 MG tablet Take 81 mg by mouth daily.  Marland Kitchen. amoxicillin-clavulanate (AUGMENTIN) 875-125 MG tablet amoxicillin 875 mg-potassium clavulanate 125 mg tablet  . ciprofloxacin (CIPRO) 500 MG tablet Take 1 tablet (500 mg total) by mouth 2 (two) times daily.  . diphenoxylate-atropine (LOMOTIL) 2.5-0.025 MG tablet Take 1 tablet by mouth 3 (three) times daily as needed for diarrhea or loose stools.  Marland Kitchen. levothyroxine (SYNTHROID, LEVOTHROID) 88 MCG tablet TAKE 1 TABLET BY MOUTH ONCE DAILY  . metroNIDAZOLE (FLAGYL) 500 MG tablet Take 1 tablet (500 mg total) by mouth 2 (two) times daily.   No facility-administered encounter medications on file as of 06/26/2017.       Medical History: Past Medical History:  Diagnosis Date  . Thyroid disease      Today's Vitals   06/26/17 1027  BP: 126/76  Pulse: 88  Resp: 16  SpO2: 98%  Weight: 294 lb 3.2 oz (133.4 kg)  Height: 5\' 8"  (1.727 m)    Review of Systems  Constitutional: Positive for fatigue. Negative for chills and unexpected weight change.       Decreased appetite.    HENT: Negative for congestion, postnasal drip, rhinorrhea, sneezing and sore throat.   Eyes: Negative for redness.  Respiratory: Positive for shortness of breath. Negative for cough and chest tightness.        Has been worse since diarrhea started this past Thursday.   Cardiovascular: Negative for chest pain and palpitations.  Gastrointestinal: Positive for abdominal pain and diarrhea. Negative for blood in stool and constipation.  Genitourinary: Negative for dysuria and frequency.  Musculoskeletal: Negative for arthralgias, back pain, joint swelling and neck pain.  Skin: Negative for rash.  Neurological: Negative.  Negative for tremors and numbness.  Hematological: Negative for adenopathy. Does not bruise/bleed easily.  Psychiatric/Behavioral: Negative for behavioral problems (Depression), sleep disturbance and suicidal ideas. The patient is not nervous/anxious.     Physical Exam  Constitutional: He is oriented to person, place, and time. He appears well-developed and well-nourished. No distress.  HENT:  Head: Normocephalic and atraumatic.  Mouth/Throat: Oropharynx is clear and moist. No oropharyngeal exudate.  Eyes: Pupils are equal, round, and reactive to light. EOM are normal.  Neck: Normal range of motion. Neck supple. No JVD present. No tracheal deviation present. No thyromegaly present.  Cardiovascular: Normal rate, regular rhythm and normal heart sounds. Exam reveals no gallop and no friction rub.  No murmur heard. Pulmonary/Chest: Effort normal and breath sounds normal. No respiratory distress. He has no wheezes.  He has no rales. He exhibits no tenderness.  Abdominal: Soft. Bowel sounds are increased. There is no hepatosplenomegaly. There is generalized tenderness. There is no CVA tenderness.  Palpable ventral hernia which is new and non-tender.  Musculoskeletal: Normal range of motion.  Lymphadenopathy:    He has no cervical adenopathy.  Neurological: He is alert and oriented  to person, place, and time. No cranial nerve deficit.  Skin: Skin is warm and dry. He is not diaphoretic.  Psychiatric: He has a normal mood and affect. His behavior is normal. Judgment and thought content normal.  Nursing note and vitals reviewed.  Assessment/Plan: 1. Diarrhea of presumed infectious origin - ciprofloxacin (CIPRO) 500 MG tablet; Take 1 tablet (500 mg total) by mouth 2 (two) times daily.  Dispense: 20 tablet; Refill: 0 - metroNIDAZOLE (FLAGYL) 500 MG tablet; Take 1 tablet (500 mg total) by mouth 2 (two) times daily.  Dispense: 20 tablet; Refill: 0 - Basic Metabolic Panel (BMET) - CBC - Stool Culture - Stool C-Diff Toxin Assay Recommend he take probiotic while on antibiotics. Samples of philips' colon health given to patient today.   2. Dehydration - Basic Metabolic Panel (BMET) - CBC  General Counseling: Bryon verbalizes understanding of the findings of todays visit and agrees with plan of treatment. I have discussed any further diagnostic evaluation that may be needed or ordered today. We also reviewed his medications today. he has been encouraged to call the office with any questions or concerns that should arise related to todays visit.  The 'BRAT' diet is suggested, then progress to diet as tolerated as symptoms abate. Call if bloody stools, persistent diarrhea, vomiting, fever or abdominal pain.  This patient was seen by Vincent Gros, FNP- C in Collaboration with Dr Lyndon Code as a part of collaborative care agreement   Orders Placed This Encounter  Procedures  . Stool Culture  . Stool C-Diff Toxin Assay  . Basic Metabolic Panel (BMET)  . CBC    Meds ordered this encounter  Medications  . ciprofloxacin (CIPRO) 500 MG tablet    Sig: Take 1 tablet (500 mg total) by mouth 2 (two) times daily.    Dispense:  20 tablet    Refill:  0    Order Specific Question:   Supervising Provider    Answer:   Lyndon Code [[id number]]  . metroNIDAZOLE (FLAGYL) 500 MG tablet     Sig: Take 1 tablet (500 mg total) by mouth 2 (two) times daily.    Dispense:  20 tablet    Refill:  0    Order Specific Question:   Supervising Provider    Answer:   Lyndon Code [[id number]]  . diphenoxylate-atropine (LOMOTIL) 2.5-0.025 MG tablet    Sig: Take 1 tablet by mouth 3 (three) times daily as needed for diarrhea or loose stools.    Dispense:  30 tablet    Refill:  0    Order Specific Question:   Supervising Provider    Answer:   Lyndon Code [[id number]]    Time spent: 15  Minutes

## 2017-07-05 ENCOUNTER — Telehealth: Payer: Self-pay | Admitting: Nurse Practitioner

## 2017-07-05 ENCOUNTER — Other Ambulatory Visit: Payer: Self-pay | Admitting: Nurse Practitioner

## 2017-07-05 DIAGNOSIS — N390 Urinary tract infection, site not specified: Secondary | ICD-10-CM

## 2017-07-05 MED ORDER — AMOXICILLIN-POT CLAVULANATE 875-125 MG PO TABS: 1.0000 | ORAL_TABLET | Freq: Two times a day (BID) | ORAL | 0 refills | Status: DC

## 2017-07-05 NOTE — Progress Notes (Signed)
Patient calling regarding uti. Sent augmentin twice daily for 7 days. Recommend use of OTC azo for any bladder pain and spasms. Should be seen if no better by Friday/monday.

## 2017-07-05 NOTE — Telephone Encounter (Signed)
Patient has been advised and will pick up meds and call to [facility] appt next week if not better/Beth

## 2017-07-05 NOTE — Telephone Encounter (Signed)
Patient calling regarding uti. Sent augmentin twice daily for 7 days. Recommend use of OTC azo for any bladder pain and spasms. Should be seen if no better by Friday/monday.

## 2017-07-19 ENCOUNTER — Telehealth: Payer: Self-pay | Admitting: Nurse Practitioner

## 2017-07-19 ENCOUNTER — Other Ambulatory Visit: Payer: Self-pay | Admitting: Nurse Practitioner

## 2017-07-19 DIAGNOSIS — L02419 Cutaneous abscess of limb, unspecified: Secondary | ICD-10-CM

## 2017-07-19 DIAGNOSIS — N390 Urinary tract infection, site not specified: Secondary | ICD-10-CM

## 2017-07-19 DIAGNOSIS — L03119 Cellulitis of unspecified part of limb: Principal | ICD-10-CM

## 2017-07-19 MED ORDER — AMOXICILLIN-POT CLAVULANATE 875-125 MG PO TABS: 1.0000 | ORAL_TABLET | Freq: Two times a day (BID) | ORAL | 0 refills | Status: DC

## 2017-07-19 NOTE — Progress Notes (Signed)
sent in rx for augmentin BID for 14 days for lower leg infectio. Sent to [facility] [address].

## 2017-07-19 NOTE — Telephone Encounter (Signed)
sent in rx for augmentin BID for 14 days for lower leg infectio. Sent to [facility] [address].  

## 2017-09-25 ENCOUNTER — Ambulatory Visit: Payer: Self-pay | Admitting: Nurse Practitioner

## 2017-11-08 ENCOUNTER — Other Ambulatory Visit: Payer: Self-pay

## 2017-11-08 MED ORDER — LEVOTHYROXINE SODIUM 88 MCG PO TABS: 88.0000 ug | ORAL_TABLET | Freq: Every day | ORAL | 5 refills | Status: DC

## 2018-06-01 ENCOUNTER — Other Ambulatory Visit: Payer: Self-pay

## 2018-06-01 MED ORDER — LEVOTHYROXINE SODIUM 88 MCG PO TABS: 88.0000 ug | ORAL_TABLET | Freq: Every day | ORAL | 0 refills | Status: DC

## 2018-06-01 NOTE — Telephone Encounter (Signed)
Spoke with pt wife need appt for further refills

## 2018-07-10 ENCOUNTER — Ambulatory Visit (INDEPENDENT_AMBULATORY_CARE_PROVIDER_SITE_OTHER): Payer: BC Managed Care – PPO | Admitting: Nurse Practitioner

## 2018-07-10 ENCOUNTER — Other Ambulatory Visit: Payer: Self-pay

## 2018-07-10 DIAGNOSIS — E039 Hypothyroidism, unspecified: Secondary | ICD-10-CM | POA: Diagnosis not present

## 2018-07-10 MED ORDER — LEVOTHYROXINE SODIUM 88 MCG PO TABS: 88.0000 ug | ORAL_TABLET | Freq: Every day | ORAL | 3 refills | Status: DC

## 2018-07-10 NOTE — Progress Notes (Signed)
[facility] [address] [place], [place] [address]  Internal MEDICINE  Telephone Visit  Patient Name: Shawn RouteDanny R Schmuhl  191478Jun 03, 2067  [id number]  Date of Service: 07/25/2018  I connected with the patient at 4:39pm by telephone and verified the patients identity using two identifiers.   I discussed the limitations, risks, security and privacy concerns of performing an evaluation and management service by telephone and the availability of in person appointments. I also discussed with the patient that there may be a patient responsible charge related to the service.  The patient expressed understanding and agrees to proceed.    Chief Complaint  Patient presents with  . Telephone Assessment  . Telephone Screen  . Hypothyroidism    The patient has been contacted via webcam for follow up visit due to concerns for spread of novel coronavirus.  The patient has no concerns or complaints. He does need to have refills of his levothyroxine. He is due to have routine, fasting labs drawn.       Current Medication: Outpatient Encounter Medications as of 07/10/2018  Medication Sig  . aspirin EC 81 MG tablet Take 81 mg by mouth daily.  Marland Kitchen. levothyroxine (SYNTHROID, LEVOTHROID) 88 MCG tablet Take 1 tablet (88 mcg total) by mouth daily.  . [DISCONTINUED] levothyroxine (SYNTHROID, LEVOTHROID) 88 MCG tablet Take 1 tablet (88 mcg total) by mouth daily.  . [DISCONTINUED] amoxicillin-clavulanate (AUGMENTIN) 875-125 MG tablet Take 1 tablet by mouth 2 (two) times daily.  . [DISCONTINUED] ciprofloxacin (CIPRO) 500 MG tablet Take 1 tablet (500 mg total) by mouth 2 (two) times daily.  . [DISCONTINUED] diphenoxylate-atropine (LOMOTIL) 2.5-0.025 MG tablet Take 1 tablet by mouth 3 (three) times daily as needed for diarrhea or loose stools.  . [DISCONTINUED] metroNIDAZOLE (FLAGYL) 500 MG tablet Take 1 tablet (500 mg total) by mouth 2 (two) times daily.   No facility-administered encounter medications on  file as of 07/10/2018.     Surgical History: Past Surgical History:  Procedure Laterality Date  . GASTRIC BYPASS    . HERNIA REPAIR      Medical History: Past Medical History:  Diagnosis Date  . Thyroid disease     Family History: Family History  Problem Relation Age of Onset  . Heart disease Mother   . Hyperlipidemia Mother   . Diabetes Mother   . Hypertension Mother     Social History   Socioeconomic History  . Marital status: Married    Spouse name: Not on file  . Number of children: Not on file  . Years of education: Not on file  . Highest education level: Not on file  Occupational History  . Not on file  Social Needs  . Financial resource strain: Not on file  . Food insecurity:    Worry: Not on file    Inability: Not on file  . Transportation needs:    Medical: Not on file    Non-medical: Not on file  Tobacco Use  . Smoking status: Never Smoker  . Smokeless tobacco: Never Used  Substance and Sexual Activity  . Alcohol use: Not Currently  . Drug use: Never  . Sexual activity: Not on file  Lifestyle  . Physical activity:    Days per week: Not on file    Minutes per session: Not on file  . Stress: Not on file  Relationships  . Social connections:    Talks on phone: Not on file    Gets together: Not on file    Attends  religious service: Not on file    Active member of club or organization: Not on file    Attends meetings of clubs or organizations: Not on file    Relationship status: Not on file  . Intimate partner violence:    Fear of current or ex partner: Not on file    Emotionally abused: Not on file    Physically abused: Not on file    Forced sexual activity: Not on file  Other Topics Concern  . Not on file  Social History Narrative  . Not on file      Review of Systems  Constitutional: Negative for activity change, chills, fatigue and unexpected weight change.  HENT: Negative for congestion, postnasal drip, rhinorrhea, sneezing and  sore throat.   Respiratory: Negative for cough, chest tightness, shortness of breath and wheezing.   Cardiovascular: Negative for chest pain and palpitations.  Gastrointestinal: Negative for abdominal pain, constipation, diarrhea, nausea and vomiting.  Endocrine: Negative for cold intolerance, heat intolerance, polydipsia and polyuria.  Musculoskeletal: Negative for arthralgias, back pain, joint swelling and neck pain.  Skin: Negative for rash.  Neurological: Negative for dizziness, tremors, numbness and headaches.  Hematological: Negative for adenopathy. Does not bruise/bleed easily.  Psychiatric/Behavioral: Negative for behavioral problems (Depression), sleep disturbance and suicidal ideas. The patient is not nervous/anxious.     Vital Signs: There were no vitals taken for this visit.   Observation/Objective:  The patient is alert and oriented. He is pleasant and answers all questions appropriately. He is in no acute distress at this time.    Assessment/Plan:  1. Acquired hypothyroidism Continue levothyroxine daily. Will send lab slip to check thyroid panel and adjust as indicated.  - levothyroxine (SYNTHROID, LEVOTHROID) 88 MCG tablet; Take 1 tablet (88 mcg total) by mouth daily.  Dispense: 90 tablet; Refill: 3   General Counseling: Lj verbalizes understanding of the findings of today's phone visit and agrees with plan of treatment. I have discussed any further diagnostic evaluation that may be needed or ordered today. We also reviewed his medications today. he has been encouraged to call the office with any questions or concerns that should arise related to todays visit.  This patient was seen by Vincent Gros FNP Collaboration with Dr Lyndon Code as a part of collaborative care agreement   Meds ordered this encounter  Medications  . levothyroxine (SYNTHROID, LEVOTHROID) 88 MCG tablet    Sig: Take 1 tablet (88 mcg total) by mouth daily.    Dispense:  90 tablet     Refill:  3    Pt need appt for further refills    Order Specific Question:   Supervising Provider    Answer:   Lyndon Code [[id number]]    Time spent: 10 Minutes    Dr Lyndon Code Internal medicine

## 2018-07-25 ENCOUNTER — Encounter: Payer: Self-pay | Admitting: Nurse Practitioner

## 2018-07-25 DIAGNOSIS — E039 Hypothyroidism, unspecified: Secondary | ICD-10-CM | POA: Insufficient documentation

## 2018-11-15 ENCOUNTER — Ambulatory Visit: Payer: BC Managed Care – PPO | Admitting: Nurse Practitioner

## 2018-12-31 ENCOUNTER — Ambulatory Visit (INDEPENDENT_AMBULATORY_CARE_PROVIDER_SITE_OTHER): Payer: BC Managed Care – PPO | Admitting: Podiatry

## 2018-12-31 ENCOUNTER — Other Ambulatory Visit: Payer: Self-pay

## 2018-12-31 ENCOUNTER — Encounter: Payer: Self-pay | Admitting: Podiatry

## 2018-12-31 DIAGNOSIS — L84 Corns and callosities: Secondary | ICD-10-CM | POA: Diagnosis not present

## 2018-12-31 NOTE — Progress Notes (Signed)
This patient presents to the office with chief complaint of a painful fifth toe left foot.  Patient states he has had pain in his fifth toe for approximately 2 months but the corn on the fifth toe has been present longer.  He says it is now causing pain and discomfort and causes him to remove his shoe at work.  He denies any history of trauma or injury to the foot.  He presents the office today for an evaluation and treatment of this painful fifth toe left foot  Vascular  Dorsalis pedis and posterior tibial pulses are weakly  Palpable due to swelling   B/L.  Capillary return  WNL.  Temperature gradient is  WNL.  Skin turgor  WNL  Sensorium  Senn Weinstein monofilament wire  WNL. Normal tactile sensation.  Nail Exam  Patient has normal nails with no evidence of bacterial or fungal infection.  Orthopedic  Exam  Muscle tone and muscle strength  WNL.  No limitations of motion feet  B/L.  No crepitus or joint effusion noted.  Foot type is unremarkable and digits show no abnormalities.  Bone spur fifth toe left foot.  Skin  No open lesions.  Normal skin texture and turgor. Heloma durum fifth toe left foot.  Heloma durum fifth toe left foot.  IE.  Debride hd 5th left.  Padding.  Discussed this condition with this patient.  RTC prn.   Gardiner Barefoot DPM

## 2019-02-04 ENCOUNTER — Telehealth: Payer: Self-pay

## 2019-02-04 NOTE — Telephone Encounter (Signed)
Pt lmom on voicemail try to call back no voice mail

## 2019-02-05 ENCOUNTER — Ambulatory Visit: Payer: BC Managed Care – PPO | Admitting: Nurse Practitioner

## 2019-02-05 ENCOUNTER — Encounter: Payer: Self-pay | Admitting: Nurse Practitioner

## 2019-02-05 VITALS — Ht 68.0 in

## 2019-02-05 DIAGNOSIS — L03115 Cellulitis of right lower limb: Secondary | ICD-10-CM

## 2019-02-05 DIAGNOSIS — L03116 Cellulitis of left lower limb: Secondary | ICD-10-CM | POA: Diagnosis not present

## 2019-02-05 MED ORDER — AMOXICILLIN-POT CLAVULANATE 875-125 MG PO TABS: 1.0000 | ORAL_TABLET | Freq: Two times a day (BID) | ORAL | 0 refills | Status: DC

## 2019-02-05 NOTE — Progress Notes (Signed)
[facility] [address] [place], [place] [address]  Internal MEDICINE  Telephone Visit  Patient Name: Isaiah Douglas  [id number]  [id number]  Date of Service: 02/05/2019  I connected with the patient at 8:32am by webcam and verified the patients identity using two identifiers.   I discussed the limitations, risks, security and privacy concerns of performing an evaluation and management service by webcam and the availability of in person appointments. I also discussed with the patient that there may be a patient responsible charge related to the service.  The patient expressed understanding and agrees to proceed.    Chief Complaint  Patient presents with  . Telephone Assessment    has a fever of 102 this weekend   . Telephone Screen  . Leg Problem    infection on left leg  . Shortness of Breath    The patient has been contacted via webcam for follow up visit due to concerns for spread of novel coronavirus. The patient states that he has swelling, pain, warmth in the lower legs. Started on Friday. Caused fever and body aches over the Saturday and Sunday. Did start some left over Augmentin for past three days. Symptoms are getting a little better. He took last one this morning. He states that there are no open areas on the skin and there is no drainage present. He frequently gets these types of infections. augmentin is what he will take to help them resolve. This has always worked in the past.       Current Medication: Outpatient Encounter Medications as of 02/05/2019  Medication Sig  . aspirin EC 81 MG tablet Take 81 mg by mouth daily.  Marland Kitchen levothyroxine (SYNTHROID, LEVOTHROID) 88 MCG tablet Take 1 tablet (88 mcg total) by mouth daily.  Marland Kitchen amoxicillin-clavulanate (AUGMENTIN) 875-125 MG tablet Take 1 tablet by mouth 2 (two) times daily.   No facility-administered encounter medications on file as of 02/05/2019.     Surgical History: Past Surgical History:  Procedure  Laterality Date  . GASTRIC BYPASS    . HERNIA REPAIR      Medical History: Past Medical History:  Diagnosis Date  . Thyroid disease     Family History: Family History  Problem Relation Age of Onset  . Heart disease Mother   . Hyperlipidemia Mother   . Diabetes Mother   . Hypertension Mother     Social History   Socioeconomic History  . Marital status: Married    Spouse name: Not on file  . Number of children: Not on file  . Years of education: Not on file  . Highest education level: Not on file  Occupational History  . Not on file  Social Needs  . Financial resource strain: Not on file  . Food insecurity    Worry: Not on file    Inability: Not on file  . Transportation needs    Medical: Not on file    Non-medical: Not on file  Tobacco Use  . Smoking status: Never Smoker  . Smokeless tobacco: Never Used  Substance and Sexual Activity  . Alcohol use: Not Currently  . Drug use: Never  . Sexual activity: Not on file  Lifestyle  . Physical activity    Days per week: Not on file    Minutes per session: Not on file  . Stress: Not on file  Relationships  . Social Musician on phone: Not on file    Gets together: Not on file  Attends religious service: Not on file    Active member of club or organization: Not on file    Attends meetings of clubs or organizations: Not on file    Relationship status: Not on file  . Intimate partner violence    Fear of current or ex partner: Not on file    Emotionally abused: Not on file    Physically abused: Not on file    Forced sexual activity: Not on file  Other Topics Concern  . Not on file  Social History Narrative  . Not on file      Review of Systems  Constitutional: Positive for activity change, fatigue and fever. Negative for chills and unexpected weight change.  HENT: Negative for congestion, postnasal drip, rhinorrhea, sneezing and sore throat.   Respiratory: Positive for shortness of breath.  Negative for cough, chest tightness and wheezing.   Cardiovascular: Positive for leg swelling. Negative for chest pain and palpitations.  Gastrointestinal: Negative for abdominal pain, constipation, diarrhea, nausea and vomiting.  Genitourinary: Negative for dysuria and frequency.  Musculoskeletal: Negative for arthralgias, back pain, joint swelling and neck pain.  Skin: Negative for rash.       Infection to skin of both lower legs. Tender, warm, and red.   Neurological: Positive for dizziness and headaches. Negative for tremors and numbness.  Hematological: Negative for adenopathy. Does not bruise/bleed easily.  Psychiatric/Behavioral: Negative for behavioral problems (Depression), sleep disturbance and suicidal ideas. The patient is not nervous/anxious.     Today's Vitals   02/05/19 0828  Height: 5\' 8"  (1.727 m)   Body mass index is 44.73 kg/m.  Observation/Objective:  The patient is alert and oriented. He is pleasant and answering all questions appropriately. Breathing is non-labored. He is in no acute distress.  The patient was able to give me view of the legs. They are swollen and very red on anterior aspect of both legs. There are no open wounds and no drainage is currently appreciated.    Assessment/Plan: 1. Bilateral cellulitis of lower leg Continue augmentin 875mg  twice daily fur duration of 14 days. Keep skin on legs clean and dry. Recommended he seek emergency care of swelling gets worse or he develops shortness of breath or high fever. He understands and voices agreement with this plan.  - amoxicillin-clavulanate (AUGMENTIN) 875-125 MG tablet; Take 1 tablet by mouth 2 (two) times daily.  Dispense: 28 tablet; Refill: 0  General Counseling: Isaiah Douglas verbalizes understanding of the findings of today's phone visit and agrees with plan of treatment. I have discussed any further diagnostic evaluation that may be needed or ordered today. We also reviewed his medications today. he has  been encouraged to call the office with any questions or concerns that should arise related to todays visit.   This patient was seen by [place] with Dr Lavera Guise as a part of collaborative care agreement  Meds ordered this encounter  Medications  . amoxicillin-clavulanate (AUGMENTIN) 875-125 MG tablet    Sig: Take 1 tablet by mouth 2 (two) times daily.    Dispense:  28 tablet    Refill:  0    Order Specific Question:   Supervising Provider    Answer:   Lavera Guise [[id number]]    Time spent: 66 Minutes    Dr Lavera Guise Internal medicine

## 2019-02-18 ENCOUNTER — Other Ambulatory Visit: Payer: Self-pay

## 2019-02-18 DIAGNOSIS — L03115 Cellulitis of right lower limb: Secondary | ICD-10-CM

## 2019-02-18 MED ORDER — AMOXICILLIN-POT CLAVULANATE 875-125 MG PO TABS: 1.0000 | ORAL_TABLET | Freq: Two times a day (BID) | ORAL | 0 refills | Status: DC

## 2019-02-18 NOTE — Telephone Encounter (Signed)
Pt called need refills for amoxicillin as per heather

## 2019-02-27 ENCOUNTER — Telehealth: Payer: Self-pay

## 2019-02-27 NOTE — Telephone Encounter (Signed)
Confirmed appointment with patient. klh °

## 2019-02-28 ENCOUNTER — Encounter: Payer: Self-pay | Admitting: Internal Medicine

## 2019-02-28 ENCOUNTER — Other Ambulatory Visit: Payer: Self-pay

## 2019-02-28 ENCOUNTER — Ambulatory Visit: Payer: BC Managed Care – PPO | Admitting: Internal Medicine

## 2019-02-28 VITALS — BP 145/78 | HR 52 | Temp 97.5°F | Resp 16 | Ht 68.0 in | Wt 361.0 lb

## 2019-02-28 DIAGNOSIS — Z0001 Encounter for general adult medical examination with abnormal findings: Secondary | ICD-10-CM | POA: Diagnosis not present

## 2019-02-28 DIAGNOSIS — L03116 Cellulitis of left lower limb: Secondary | ICD-10-CM | POA: Diagnosis not present

## 2019-02-28 DIAGNOSIS — I890 Lymphedema, not elsewhere classified: Secondary | ICD-10-CM | POA: Diagnosis not present

## 2019-02-28 DIAGNOSIS — R001 Bradycardia, unspecified: Secondary | ICD-10-CM

## 2019-02-28 DIAGNOSIS — E039 Hypothyroidism, unspecified: Secondary | ICD-10-CM

## 2019-02-28 DIAGNOSIS — Z9884 Bariatric surgery status: Secondary | ICD-10-CM

## 2019-02-28 DIAGNOSIS — Z125 Encounter for screening for malignant neoplasm of prostate: Secondary | ICD-10-CM | POA: Diagnosis not present

## 2019-02-28 MED ORDER — LEVOFLOXACIN 500 MG PO TABS: 500.0000 mg | ORAL_TABLET | Freq: Every day | ORAL | 0 refills | Status: DC

## 2019-02-28 NOTE — Progress Notes (Signed)
[facility] [place], [place] [address]  Internal MEDICINE  Office Visit Note  Patient Name: Isaiah Douglas  [id number]  [id number]  Date of Service: 02/28/2019  Chief Complaint  Patient presents with  . Hypothyroidism  . Cyst    left leg  . Quality Metric Gaps    colonoscopy    HPI Pt is here with c/o redness/swelling and a knot at LE. He does have chronic edema and cellulitis and gets treated periodically. About 3 week ago he was treated for cellulitis with antibiotics. His leg is still red and now has an area of localized swelling. Pt has morbid obesity and suffers from Lymphedema as well. He had gastric bypass (LAPAROSCOPIC ROUX-EN-Y FOR GASTRIC BYPASS). He has not followed up with any follow up care, initially he lost weight to 262 ( minimum weight) now he has gained over 100 lbs again. No recent labs, PSA on file.Pt also had cholecystectomy as well    Current Medication: Outpatient Encounter Medications as of 02/28/2019  Medication Sig  . amoxicillin-clavulanate (AUGMENTIN) 875-125 MG tablet Take 1 tablet by mouth 2 (two) times daily.  Marland Kitchen aspirin EC 81 MG tablet Take 81 mg by mouth daily.  Marland Kitchen levothyroxine (SYNTHROID, LEVOTHROID) 88 MCG tablet Take 1 tablet (88 mcg total) by mouth daily.  Marland Kitchen levofloxacin (LEVAQUIN) 500 MG tablet Take 1 tablet (500 mg total) by mouth daily.   No facility-administered encounter medications on file as of 02/28/2019.     Surgical History: Past Surgical History:  Procedure Laterality Date  . CHOLECYSTECTOMY, LAPAROSCOPIC    . GASTRIC BYPASS    . HERNIA REPAIR      Medical History: Past Medical History:  Diagnosis Date  . Hypothyroidism   . Lymphedema   . Morbid obesity due to excess calories ([place])     Family History: Family History  Problem Relation Age of Onset  . Heart disease Mother   . Hyperlipidemia Mother   . Diabetes Mother   . Hypertension Mother     Social History   Socioeconomic History  .  Marital status: Married    Spouse name: Not on file  . Number of children: Not on file  . Years of education: Not on file  . Highest education level: Not on file  Occupational History  . Not on file  Social Needs  . Financial resource strain: Not on file  . Food insecurity    Worry: Not on file    Inability: Not on file  . Transportation needs    Medical: Not on file    Non-medical: Not on file  Tobacco Use  . Smoking status: Never Smoker  . Smokeless tobacco: Never Used  Substance and Sexual Activity  . Alcohol use: Not Currently  . Drug use: Never  . Sexual activity: Not on file  Lifestyle  . Physical activity    Days per week: Not on file    Minutes per session: Not on file  . Stress: Not on file  Relationships  . Social Herbalist on phone: Not on file    Gets together: Not on file    Attends religious service: Not on file    Active member of club or organization: Not on file    Attends meetings of clubs or organizations: Not on file    Relationship status: Not on file  . Intimate partner violence    Fear of current or ex partner: Not on file  Emotionally abused: Not on file    Physically abused: Not on file    Forced sexual activity: Not on file  Other Topics Concern  . Not on file  Social History Narrative  . Not on file   Review of Systems  Constitutional: Negative for chills, fatigue and unexpected weight change.  HENT: Negative for congestion, postnasal drip, rhinorrhea, sneezing and sore throat.   Eyes: Negative for redness.  Respiratory: Negative for cough, chest tightness and shortness of breath.   Cardiovascular: Positive for leg swelling. Negative for chest pain and palpitations.  Gastrointestinal: Negative for abdominal pain, constipation, diarrhea, nausea and vomiting.  Genitourinary: Negative for dysuria and frequency.  Musculoskeletal: Negative for arthralgias, back pain, joint swelling and neck pain.  Skin: Positive for rash.   Neurological: Negative.  Negative for tremors and numbness.  Hematological: Negative for adenopathy. Does not bruise/bleed easily.  Psychiatric/Behavioral: Negative for behavioral problems (Depression), sleep disturbance and suicidal ideas. The patient is not nervous/anxious.    Vital Signs: BP (!) 145/78   Pulse (!) 52   Temp (!) 97.5 F (36.4 C)   Resp 16   Ht 5\' 8"  (1.727 m)   Wt (!) 361 lb (163.7 kg)   SpO2 98%   BMI 54.89 kg/m   Physical Exam Constitutional:      Appearance: Normal appearance. He is obese.  Cardiovascular:     Rate and Rhythm: Regular rhythm.  Pulmonary:     Effort: Pulmonary effort is normal.     Breath sounds: Normal breath sounds.  Musculoskeletal:        General: Swelling and deformity present.     Right lower leg: Edema present.     Left lower leg: Edema present.  Skin:    General: Skin is warm.     Findings: Erythema, lesion and rash present.  Neurological:     General: No focal deficit present.     Mental Status: He is alert.   Assessment/Plan: 1. Lymphedema of both lower extremities - Chronic, will like to add diuretic however need baseline electrolytes  - Lipid Panel With LDL/HDL Ratio; Future - Comprehensive metabolic panel  2. Cellulitis of left lower extremity - Add Levaquin 500mg  qd x 10 days  - CBC with Differential/Platelet; Future - VAS US [place] EXTREMITY VENOUS (DVT); Future  3. Bradycardia - Needs monitoring  - TSH; Future - T4, free; Future  4. Special screening, prostate cancer - PSA  5. Hypothyroidism, unspecified type - Continue Synthroid as before  - TSH; Future - T4, free; Future  6. H/O gastric bypass - Pt has not had follow up on labs  - B12 and Folate Panel - Ferritin; Future  General Counseling: Isaiah Douglas verbalizes understanding of the findings of todays visit and agrees with plan of treatment. I have discussed any further diagnostic evaluation that may be needed or ordered today. We also reviewed his  medications today. he has been encouraged to call the office with any questions or concerns that should arise related to todays visit.  Orders Placed This Encounter  Procedures  . CBC with Differential/Platelet  . Lipid Panel With LDL/HDL Ratio  . TSH  . T4, free  . Comprehensive metabolic panel  . B12 and Folate Panel  . Ferritin  . PSA  . VAS US LOWER EXTREMITY VENOUS (DVT)    Meds ordered this encounter  Medications  . levofloxacin (LEVAQUIN) 500 MG tablet    Sig: Take 1 tablet (500 mg total) by mouth daily.  Dispense:  10 tablet    Refill:  0    Time spent:25  Minutes  Dr Lyndon Code Internal medicine

## 2019-03-01 LAB — COMPREHENSIVE METABOLIC PANEL
ALT: 19 IU/L (ref 0–44)
AST: 24 IU/L (ref 0–40)
Albumin/Globulin Ratio: 1.6 (ref 1.2–2.2)
Albumin: 4.2 g/dL (ref 3.8–4.9)
Alkaline Phosphatase: 70 IU/L (ref 39–117)
BUN/Creatinine Ratio: 18 (ref 9–20)
BUN: 16 mg/dL (ref 6–24)
Bilirubin Total: 0.3 mg/dL (ref 0.0–1.2)
CO2: 21 mmol/L (ref 20–29)
Calcium: 9 mg/dL (ref 8.7–10.2)
Chloride: 104 mmol/L (ref 96–106)
Creatinine, Ser: 0.87 mg/dL (ref 0.76–1.27)
GFR calc Af Amer: 112 mL/min/{1.73_m2} (ref >59)
GFR calc non Af Amer: 97 mL/min/{1.73_m2} (ref >59)
Globulin, Total: 2.7 g/dL (ref 1.5–4.5)
Glucose: 83 mg/dL (ref 65–99)
Potassium: 4.5 mmol/L (ref 3.5–5.2)
Sodium: 138 mmol/L (ref 134–144)
Total Protein: 6.9 g/dL (ref 6.0–8.5)

## 2019-03-01 LAB — B12 AND FOLATE PANEL
Folate: 5 ng/mL (ref >3.0)
Vitamin B-12: 203 pg/mL — ABNORMAL LOW (ref 232–1245)

## 2019-03-01 LAB — PSA: Prostate Specific Ag, Serum: 0.4 ng/mL (ref 0.0–4.0)

## 2019-03-06 ENCOUNTER — Telehealth: Payer: Self-pay

## 2019-03-06 NOTE — Telephone Encounter (Signed)
Confirmed appointment with patient. klh °

## 2019-03-07 ENCOUNTER — Telehealth: Payer: Self-pay

## 2019-03-07 NOTE — Telephone Encounter (Signed)
CONFIRMED AND SCREENED FOR 03-11-19 OV. 

## 2019-03-08 ENCOUNTER — Ambulatory Visit: Payer: BC Managed Care – PPO

## 2019-03-08 ENCOUNTER — Other Ambulatory Visit: Payer: Self-pay

## 2019-03-08 DIAGNOSIS — L03116 Cellulitis of left lower limb: Secondary | ICD-10-CM

## 2019-03-08 DIAGNOSIS — L0390 Cellulitis, unspecified: Secondary | ICD-10-CM | POA: Diagnosis not present

## 2019-03-11 ENCOUNTER — Other Ambulatory Visit: Payer: Self-pay

## 2019-03-11 ENCOUNTER — Ambulatory Visit: Payer: BC Managed Care – PPO | Admitting: Nurse Practitioner

## 2019-03-11 ENCOUNTER — Encounter: Payer: Self-pay | Admitting: Nurse Practitioner

## 2019-03-11 VITALS — BP 136/92 | HR 52 | Temp 97.1°F | Resp 16 | Ht 68.0 in | Wt 356.0 lb

## 2019-03-11 DIAGNOSIS — Z9884 Bariatric surgery status: Secondary | ICD-10-CM

## 2019-03-11 DIAGNOSIS — I890 Lymphedema, not elsewhere classified: Secondary | ICD-10-CM

## 2019-03-11 DIAGNOSIS — L03116 Cellulitis of left lower limb: Secondary | ICD-10-CM

## 2019-03-11 DIAGNOSIS — E039 Hypothyroidism, unspecified: Secondary | ICD-10-CM

## 2019-03-11 DIAGNOSIS — R001 Bradycardia, unspecified: Secondary | ICD-10-CM

## 2019-03-11 DIAGNOSIS — R229 Localized swelling, mass and lump, unspecified: Secondary | ICD-10-CM | POA: Diagnosis not present

## 2019-03-11 DIAGNOSIS — L03115 Cellulitis of right lower limb: Secondary | ICD-10-CM

## 2019-03-11 DIAGNOSIS — E538 Deficiency of other specified B group vitamins: Secondary | ICD-10-CM | POA: Diagnosis not present

## 2019-03-11 MED ORDER — CYANOCOBALAMIN 1000 MCG/ML IJ SOLN
1000.0000 ug | Freq: Once | INTRAMUSCULAR | Status: AC
  Administered 2019-03-11: 1000 ug via INTRAMUSCULAR

## 2019-03-11 MED ORDER — AMOXICILLIN-POT CLAVULANATE 875-125 MG PO TABS: 1.0000 | ORAL_TABLET | Freq: Two times a day (BID) | ORAL | 2 refills | Status: DC

## 2019-03-11 NOTE — Progress Notes (Signed)
[facility] [address] [place], [place] [address]  Internal MEDICINE  Office Visit Note  Patient Name: Isaiah Douglas  [id number]  [id number]  Date of Service: 03/13/2019  Chief Complaint  Patient presents with  . Follow-up    ultrasound results    The patient is here for follow up visit. Recently had ultrasound of his lower extremities. He had had flare of cellulitis in the left lower leg which dod not get better with usual antibiotic course. He did have to follow augmentin with round of levofloxacin. With cellulitis, he developed an area on inner aspect of his left knee which feels like a palpable mass. Was tender and very sore. Was red and warm to palpate. This has improved, however, the swellling and mass are still present. His ultrasound was negative for evidence of DVT. Does show consolidation and soft tissue swelling in the area of palpable mass. He did have some labs done. B12 level is low at 202. Some labs were not done, and he will have these done prior to his next visit.       Current Medication: Outpatient Encounter Medications as of 03/11/2019  Medication Sig  . amoxicillin-clavulanate (AUGMENTIN) 875-125 MG tablet Take 1 tablet by mouth 2 (two) times daily.  Marland Kitchen. aspirin EC 81 MG tablet Take 81 mg by mouth daily.  Marland Kitchen. levothyroxine (SYNTHROID, LEVOTHROID) 88 MCG tablet Take 1 tablet (88 mcg total) by mouth daily.  . [DISCONTINUED] amoxicillin-clavulanate (AUGMENTIN) 875-125 MG tablet Take 1 tablet by mouth 2 (two) times daily.  . [DISCONTINUED] levofloxacin (LEVAQUIN) 500 MG tablet Take 1 tablet (500 mg total) by mouth daily.  . [EXPIRED] cyanocobalamin ((VITAMIN B-12)) injection 1,000 mcg    No facility-administered encounter medications on file as of 03/11/2019.    Surgical History: Past Surgical History:  Procedure Laterality Date  . CHOLECYSTECTOMY, LAPAROSCOPIC    . GASTRIC BYPASS    . HERNIA REPAIR      Medical History: Past Medical History:   Diagnosis Date  . Hypothyroidism   . Lymphedema   . Morbid obesity due to excess calories ([facility])     Family History: Family History  Problem Relation Age of Onset  . Heart disease Mother   . Hyperlipidemia Mother   . Diabetes Mother   . Hypertension Mother     Social History   Socioeconomic History  . Marital status: Married    Spouse name: Not on file  . Number of children: Not on file  . Years of education: Not on file  . Highest education level: Not on file  Occupational History  . Not on file  Tobacco Use  . Smoking status: Never Smoker  . Smokeless tobacco: Never Used  Substance and Sexual Activity  . Alcohol use: Not Currently  . Drug use: Never  . Sexual activity: Not on file  Other Topics Concern  . Not on file  Social History Narrative  . Not on file   Social Determinants of Health   Financial Resource Strain:   . Difficulty of Paying Living Expenses: Not on file  Food Insecurity:   . Worried About Programme researcher, broadcasting/film/videounning Out of Food in the Last Year: Not on file  . Ran Out of Food in the Last Year: Not on file  Transportation Needs:   . Lack of Transportation (Medical): Not on file  . Lack of Transportation (Non-Medical): Not on file  Physical Activity:   . Days of Exercise per Week: Not on file  . Minutes  of Exercise per Session: Not on file  Stress:   . Feeling of Stress : Not on file  Social Connections:   . Frequency of Communication with Friends and Family: Not on file  . Frequency of Social Gatherings with Friends and Family: Not on file  . Attends Religious Services: Not on file  . Active Member of Clubs or Organizations: Not on file  . Attends Archivist Meetings: Not on file  . Marital Status: Not on file  Intimate Partner Violence:   . Fear of Current or Ex-Partner: Not on file  . Emotionally Abused: Not on file  . Physically Abused: Not on file  . Sexually Abused: Not on file      Review of Systems  Constitutional: Positive for  fatigue. Negative for chills and unexpected weight change.  HENT: Negative for congestion, postnasal drip, rhinorrhea, sneezing and sore throat.   Eyes: Negative for redness.  Respiratory: Negative for cough, chest tightness and shortness of breath.   Cardiovascular: Positive for leg swelling. Negative for chest pain and palpitations.       Improved since his last visit.   Gastrointestinal: Negative for abdominal pain, constipation, diarrhea, nausea and vomiting.  Genitourinary: Negative for dysuria and frequency.  Musculoskeletal: Positive for arthralgias. Negative for back pain, joint swelling and neck pain.  Skin: Negative for rash.       Palpable mass just under the skin lf the left calf.   Allergic/Immunologic: Negative for environmental allergies.  Neurological: Negative for dizziness, tremors, numbness and headaches.  Hematological: Negative for adenopathy. Does not bruise/bleed easily.  Psychiatric/Behavioral: Negative for behavioral problems (Depression), sleep disturbance and suicidal ideas. The patient is not nervous/anxious.     Today's Vitals   03/11/19 1537  BP: (!) 136/92  Pulse: (!) 52  Resp: 16  Temp: (!) 97.1 F (36.2 C)  SpO2: 99%  Weight: (!) 356 lb (161.5 kg)  Height: 5\' 8"  (1.727 m)   Body mass index is 54.13 kg/m.  Physical Exam Vitals and nursing note reviewed.  Constitutional:      General: He is not in acute distress.    Appearance: Normal appearance. He is well-developed. He is obese. He is not diaphoretic.  HENT:     Head: Normocephalic and atraumatic.     Mouth/Throat:     Pharynx: No oropharyngeal exudate.  Eyes:     Pupils: Pupils are equal, round, and reactive to light.  Neck:     Thyroid: No thyromegaly.     Vascular: No JVD.     Trachea: No tracheal deviation.  Cardiovascular:     Rate and Rhythm: Normal rate and regular rhythm.     Heart sounds: Normal heart sounds. No murmur. No friction rub. No gallop.   Pulmonary:     Effort:  Pulmonary effort is normal. No respiratory distress.     Breath sounds: Normal breath sounds. No wheezing or rales.  Chest:     Chest wall: No tenderness.  Abdominal:     Palpations: Abdomen is soft.  Musculoskeletal:        General: Normal range of motion.     Cervical back: Normal range of motion and neck supple.  Lymphadenopathy:     Cervical: No cervical adenopathy.  Skin:    General: Skin is warm and dry.       Neurological:     Mental Status: He is alert and oriented to person, place, and time.     Cranial Nerves: No cranial  nerve deficit.  Psychiatric:        Behavior: Behavior normal.        Thought Content: Thought content normal.        Judgment: Judgment normal.     Assessment/Plan: 1. Lymphedema of both lower extremities Reviewed ultrasound of lower leg with patient. No evidence of DVT or impaired venous return.  - Lipid Panel With LDL/HDL Ratio  2. Local superficial swelling, mass or lump Reviewed ultrasound of lower leg with patient. No evidence of DVT or impaired venous return.  Does have soft tissue consolidation and swelling in the medial left lower leg. Will get CT for further evaluation. Refer as indicated.  - CT EXTREMITY LOWER LEFT WO CONTRAST; Future  3. B12 deficiency Low b12 level. Start weekly b12 injections for next 4 weeks then monthly b12 injections for remainder of 6 months.  - cyanocobalamin ((VITAMIN B-12)) injection 1,000 mcg  4. Hypothyroidism, unspecified type Check thyroid panel and adjust levothyroxine dose as indicated.  - T4, free - TSH  5. Bradycardia Stable and normal for patient. Continue to monitor - T4, free - TSH  6. H/O gastric bypass Check ferritin level.  - Ferritin  7. Bilateral cellulitis of lower leg Will follow up with augmrntin 875mg  bid for 14 days. Start as needed - amoxicillin-clavulanate (AUGMENTIN) 875-125 MG tablet; Take 1 tablet by mouth 2 (two) times daily.  Dispense: 28 tablet; Refill: 2  General  Counseling: Krystopher verbalizes understanding of the findings of todays visit and agrees with plan of treatment. I have discussed any further diagnostic evaluation that may be needed or ordered today. We also reviewed his medications today. he has been encouraged to call the office with any questions or concerns that should arise related to todays visit.  This patient was seen by FNP Collaboration with Dr Vincent Gros as a part of collaborative care agreement  Orders Placed This Encounter  Procedures  . CT EXTREMITY LOWER LEFT WO CONTRAST    Meds ordered this encounter  Medications  . cyanocobalamin ((VITAMIN B-12)) injection 1,000 mcg  . amoxicillin-clavulanate (AUGMENTIN) 875-125 MG tablet    Sig: Take 1 tablet by mouth 2 (two) times daily.    Dispense:  28 tablet    Refill:  2    Order Specific Question:   Supervising Provider    Answer:   Lyndon Code [[id number]]    Time spent: 80 Minutes      Dr 22 Internal medicine

## 2019-03-11 NOTE — Progress Notes (Signed)
Will do. I see him back later today. Looks like only abnormal lab was b12

## 2019-03-12 ENCOUNTER — Telehealth: Payer: Self-pay

## 2019-03-12 NOTE — Telephone Encounter (Signed)
Tried calling pt to advise of ct scan, pt does not have voice mail and I was unable to leave a message regarding Ct appt and follow up . beth

## 2019-03-13 DIAGNOSIS — E538 Deficiency of other specified B group vitamins: Secondary | ICD-10-CM | POA: Insufficient documentation

## 2019-03-13 DIAGNOSIS — R229 Localized swelling, mass and lump, unspecified: Secondary | ICD-10-CM | POA: Insufficient documentation

## 2019-03-13 DIAGNOSIS — I890 Lymphedema, not elsewhere classified: Secondary | ICD-10-CM | POA: Insufficient documentation

## 2019-03-13 DIAGNOSIS — R001 Bradycardia, unspecified: Secondary | ICD-10-CM | POA: Insufficient documentation

## 2019-03-13 DIAGNOSIS — Z9884 Bariatric surgery status: Secondary | ICD-10-CM | POA: Insufficient documentation

## 2019-03-19 ENCOUNTER — Other Ambulatory Visit: Payer: Self-pay

## 2019-03-19 ENCOUNTER — Ambulatory Visit (INDEPENDENT_AMBULATORY_CARE_PROVIDER_SITE_OTHER): Payer: BC Managed Care – PPO

## 2019-03-19 DIAGNOSIS — E538 Deficiency of other specified B group vitamins: Secondary | ICD-10-CM | POA: Diagnosis not present

## 2019-03-19 MED ORDER — CYANOCOBALAMIN 1000 MCG/ML IJ SOLN
1000.0000 ug | Freq: Once | INTRAMUSCULAR | Status: AC
  Administered 2019-03-19: 1000 ug via INTRAMUSCULAR

## 2019-03-20 ENCOUNTER — Telehealth: Payer: Self-pay

## 2019-03-20 ENCOUNTER — Ambulatory Visit: Payer: BC Managed Care – PPO

## 2019-03-20 NOTE — Telephone Encounter (Signed)
Rescheduled appointment on 03/25/2019 to 04/11/2019, patient having ct done 04/05/2019. klh

## 2019-03-25 ENCOUNTER — Ambulatory Visit: Payer: BC Managed Care – PPO | Admitting: Adult Health

## 2019-03-27 ENCOUNTER — Ambulatory Visit (INDEPENDENT_AMBULATORY_CARE_PROVIDER_SITE_OTHER): Payer: BC Managed Care – PPO

## 2019-03-27 ENCOUNTER — Other Ambulatory Visit: Payer: Self-pay

## 2019-03-27 DIAGNOSIS — E538 Deficiency of other specified B group vitamins: Secondary | ICD-10-CM

## 2019-03-27 MED ORDER — CYANOCOBALAMIN 1000 MCG/ML IJ SOLN
1000.0000 ug | Freq: Once | INTRAMUSCULAR | Status: AC
  Administered 2019-03-27: 16:00:00 1000 ug via INTRAMUSCULAR

## 2019-04-03 ENCOUNTER — Ambulatory Visit (INDEPENDENT_AMBULATORY_CARE_PROVIDER_SITE_OTHER): Payer: BC Managed Care – PPO

## 2019-04-03 ENCOUNTER — Other Ambulatory Visit: Payer: Self-pay

## 2019-04-03 DIAGNOSIS — E538 Deficiency of other specified B group vitamins: Secondary | ICD-10-CM | POA: Diagnosis not present

## 2019-04-03 MED ORDER — CYANOCOBALAMIN 1000 MCG/ML IJ SOLN
1000.0000 ug | Freq: Once | INTRAMUSCULAR | Status: AC
  Administered 2019-04-03: 1000 ug via INTRAMUSCULAR

## 2019-04-05 ENCOUNTER — Other Ambulatory Visit: Payer: Self-pay

## 2019-04-05 ENCOUNTER — Ambulatory Visit
Admission: RE | Admit: 2019-04-05 | Discharge: 2019-04-05 | Disposition: A | Discharge status: Home / Self Care | Payer: BC Managed Care – PPO | Source: Ambulatory Visit | Attending: Nurse Practitioner | Admitting: Nurse Practitioner

## 2019-04-05 DIAGNOSIS — R229 Localized swelling, mass and lump, unspecified: Secondary | ICD-10-CM | POA: Diagnosis not present

## 2019-04-05 DIAGNOSIS — L03116 Cellulitis of left lower limb: Secondary | ICD-10-CM | POA: Diagnosis not present

## 2019-04-05 DIAGNOSIS — I890 Lymphedema, not elsewhere classified: Secondary | ICD-10-CM | POA: Diagnosis not present

## 2019-04-05 DIAGNOSIS — E039 Hypothyroidism, unspecified: Secondary | ICD-10-CM | POA: Diagnosis not present

## 2019-04-05 DIAGNOSIS — L03115 Cellulitis of right lower limb: Secondary | ICD-10-CM | POA: Diagnosis not present

## 2019-04-05 DIAGNOSIS — R001 Bradycardia, unspecified: Secondary | ICD-10-CM | POA: Diagnosis not present

## 2019-04-05 DIAGNOSIS — Z9884 Bariatric surgery status: Secondary | ICD-10-CM | POA: Diagnosis not present

## 2019-04-05 MED ORDER — IOHEXOL 350 MG/ML SOLN
100.0000 mL | Freq: Once | INTRAVENOUS | Status: AC | PRN
  Administered 2019-04-05: 100 mL via INTRAVENOUS

## 2019-04-06 LAB — CBC WITH DIFFERENTIAL/PLATELET
Basophils Absolute: 0.1 10*3/uL (ref 0.0–0.2)
Basos: 1 %
EOS (ABSOLUTE): 0.1 10*3/uL (ref 0.0–0.4)
Eos: 2 %
Hematocrit: 45.8 % (ref 37.5–51.0)
Hemoglobin: 15.2 g/dL (ref 13.0–17.7)
Immature Grans (Abs): 0 10*3/uL (ref 0.0–0.1)
Immature Granulocytes: 0 %
Lymphocytes Absolute: 2.9 10*3/uL (ref 0.7–3.1)
Lymphs: 43 %
MCH: 30 pg (ref 26.6–33.0)
MCHC: 33.2 g/dL (ref 31.5–35.7)
MCV: 91 fL (ref 79–97)
Monocytes Absolute: 0.6 10*3/uL (ref 0.1–0.9)
Monocytes: 9 %
Neutrophils Absolute: 3 10*3/uL (ref 1.4–7.0)
Neutrophils: 45 %
Platelets: 207 10*3/uL (ref 150–450)
RBC: 5.06 x10E6/uL (ref 4.14–5.80)
RDW: 13.2 % (ref 11.6–15.4)
WBC: 6.6 10*3/uL (ref 3.4–10.8)

## 2019-04-06 LAB — LIPID PANEL WITH LDL/HDL RATIO
Cholesterol, Total: 197 mg/dL (ref 100–199)
HDL: 50 mg/dL (ref >39)
LDL Chol Calc (NIH): 113 mg/dL — ABNORMAL HIGH (ref 0–99)
LDL/HDL Ratio: 2.3 ratio (ref 0.0–3.6)
Triglycerides: 196 mg/dL — ABNORMAL HIGH (ref 0–149)
VLDL Cholesterol Cal: 34 mg/dL (ref 5–40)

## 2019-04-06 LAB — FERRITIN: Ferritin: 182 ng/mL (ref 30–400)

## 2019-04-06 LAB — T4, FREE: Free T4: 1.17 ng/dL (ref 0.82–1.77)

## 2019-04-06 LAB — TSH: TSH: 4.28 u[IU]/mL (ref 0.450–4.500)

## 2019-04-06 NOTE — Progress Notes (Signed)
Hey. This patient sees you 04/11/2019. I think he needs to have referral to surgery.

## 2019-04-11 ENCOUNTER — Ambulatory Visit: Payer: BC Managed Care – PPO | Admitting: Adult Health

## 2019-04-11 ENCOUNTER — Encounter: Payer: Self-pay | Admitting: Adult Health

## 2019-04-11 DIAGNOSIS — E781 Pure hyperglyceridemia: Secondary | ICD-10-CM | POA: Diagnosis not present

## 2019-04-11 DIAGNOSIS — L03116 Cellulitis of left lower limb: Secondary | ICD-10-CM | POA: Diagnosis not present

## 2019-04-11 DIAGNOSIS — E039 Hypothyroidism, unspecified: Secondary | ICD-10-CM | POA: Diagnosis not present

## 2019-04-11 DIAGNOSIS — R229 Localized swelling, mass and lump, unspecified: Secondary | ICD-10-CM

## 2019-04-11 NOTE — Progress Notes (Signed)
[facility] [place], [place] [address]  Internal MEDICINE  Telephone Visit  Patient Name: Isaiah Douglas  [id number]  [id number]  Date of Service: 04/11/2019  I connected with the patient at 419 by telephone and verified the patients identity using two identifiers.   I discussed the limitations, risks, security and privacy concerns of performing an evaluation and management service by telephone and the availability of in person appointments. I also discussed with the patient that there may be a patient responsible charge related to the service.  The patient expressed understanding and agrees to proceed.    Chief Complaint  Patient presents with  . Telephone Assessment  . Telephone Screen  . Follow-up    CT    HPI  Pt is seen via video for follow up on Ct scan and labs.  He reports overall he is doing well.  The Scan was done of his left leg/knee due to a hard, uncomfortable are below his knee.  The Ct shows. IMPRESSION: 1. No focal soft tissue mass at the medial aspect of the proximal calf to correspond with reported palpable abnormality. 2. Marked circumferential subcutaneous edema throughout the visualized lower extremity with a small amount of layering fluid overlying the investing fascia of the calf musculature. Findings are compatible with patient's history of lymphedema although cellulitis would have a similar appearance in the appropriate clinical setting. No well-defined or drainable fluid collections. No soft tissue gas. 3. Partially visualized soft tissue pannus at the medial left thigh shows prominent dependent fluid accumulation with skin thickening. No focal skin ulceration evident by CT. 4. Moderate to severe tricompartmental osteoarthritis of the left knee. Trace knee joint effusion. Offered patient surgical consult at this time, he declined, saying it was not bothering him and he will consider it in the future.  His bp is well controlled  and his lipid panel if reivewed with him at this time.   Current Medication: Outpatient Encounter Medications as of 04/11/2019  Medication Sig  . amoxicillin-clavulanate (AUGMENTIN) 875-125 MG tablet Take 1 tablet by mouth 2 (two) times daily.  Marland Kitchen aspirin EC 81 MG tablet Take 81 mg by mouth daily.  Marland Kitchen levothyroxine (SYNTHROID, LEVOTHROID) 88 MCG tablet Take 1 tablet (88 mcg total) by mouth daily.   No facility-administered encounter medications on file as of 04/11/2019.    Surgical History: Past Surgical History:  Procedure Laterality Date  . CHOLECYSTECTOMY, LAPAROSCOPIC    . GASTRIC BYPASS    . HERNIA REPAIR      Medical History: Past Medical History:  Diagnosis Date  . Hypothyroidism   . Lymphedema   . Morbid obesity due to excess calories ([place])     Family History: Family History  Problem Relation Age of Onset  . Heart disease Mother   . Hyperlipidemia Mother   . Diabetes Mother   . Hypertension Mother     Social History   Socioeconomic History  . Marital status: Married    Spouse name: Not on file  . Number of children: Not on file  . Years of education: Not on file  . Highest education level: Not on file  Occupational History  . Not on file  Tobacco Use  . Smoking status: Never Smoker  . Smokeless tobacco: Never Used  Substance and Sexual Activity  . Alcohol use: Not Currently  . Drug use: Never  . Sexual activity: Not on file  Other Topics Concern  . Not on file  Social History Narrative  .  Not on file   Social Determinants of Health   Financial Resource Strain:   . Difficulty of Paying Living Expenses: Not on file  Food Insecurity:   . Worried About Programme researcher, broadcasting/film/video in the Last Year: Not on file  . Ran Out of Food in the Last Year: Not on file  Transportation Needs:   . Lack of Transportation (Medical): Not on file  . Lack of Transportation (Non-Medical): Not on file  Physical Activity:   . Days of Exercise per Week: Not on file  . Minutes  of Exercise per Session: Not on file  Stress:   . Feeling of Stress : Not on file  Social Connections:   . Frequency of Communication with Friends and Family: Not on file  . Frequency of Social Gatherings with Friends and Family: Not on file  . Attends Religious Services: Not on file  . Active Member of Clubs or Organizations: Not on file  . Attends Banker Meetings: Not on file  . Marital Status: Not on file  Intimate Partner Violence:   . Fear of Current or Ex-Partner: Not on file  . Emotionally Abused: Not on file  . Physically Abused: Not on file  . Sexually Abused: Not on file      Review of Systems  Constitutional: Negative.  Negative for chills, fatigue and unexpected weight change.  HENT: Negative.  Negative for congestion, rhinorrhea, sneezing and sore throat.   Eyes: Negative for redness.  Respiratory: Negative.  Negative for cough, chest tightness and shortness of breath.   Cardiovascular: Negative.  Negative for chest pain and palpitations.  Gastrointestinal: Negative.  Negative for abdominal pain, constipation, diarrhea, nausea and vomiting.  Endocrine: Negative.   Genitourinary: Negative.  Negative for dysuria and frequency.  Musculoskeletal: Negative.  Negative for arthralgias, back pain, joint swelling and neck pain.  Skin: Negative.  Negative for rash.  Allergic/Immunologic: Negative.   Neurological: Negative.  Negative for tremors and numbness.  Hematological: Negative for adenopathy. Does not bruise/bleed easily.  Psychiatric/Behavioral: Negative.  Negative for behavioral problems, sleep disturbance and suicidal ideas. The patient is not nervous/anxious.     Vital Signs: There were no vitals taken for this visit.   Observation/Objective:  Well appearing NAD noted   Assessment/Plan: 1. Cellulitis of left lower extremity Resolved, patient denies any issues at this time.   2. Local superficial swelling, mass or lump Mostly resolved per  patient.  Discussed Ct results, and offered surgical consult  He declined at this time, will let us know if it returns or if he decides to see surgery.  3. Hypothyroidism, unspecified type Stable, continue present management.   4. Hypertriglyceridemia Discussed lifestyle modifications, and statin therapy.  Pt declined statin therapy and would like to try lifestyle modifications.   General Counseling: Isaiah Douglas verbalizes understanding of the findings of today's phone visit and agrees with plan of treatment. I have discussed any further diagnostic evaluation that may be needed or ordered today. We also reviewed his medications today. he has been encouraged to call the office with any questions or concerns that should arise related to todays visit.    No orders of the defined types were placed in this encounter.   No orders of the defined types were placed in this encounter.   Time spent: 15 Minutes    Blima Ledger AGNP-C Internal medicine

## 2019-05-11 DIAGNOSIS — M62838 Other muscle spasm: Secondary | ICD-10-CM | POA: Diagnosis not present

## 2019-05-11 DIAGNOSIS — M542 Cervicalgia: Secondary | ICD-10-CM | POA: Diagnosis not present

## 2019-07-04 ENCOUNTER — Ambulatory Visit: Payer: BC Managed Care – PPO | Admitting: Podiatry

## 2019-07-04 ENCOUNTER — Encounter: Payer: Self-pay | Admitting: Podiatry

## 2019-07-04 ENCOUNTER — Other Ambulatory Visit: Payer: Self-pay

## 2019-07-04 VITALS — Temp 97.9°F

## 2019-07-04 DIAGNOSIS — L84 Corns and callosities: Secondary | ICD-10-CM | POA: Diagnosis not present

## 2019-07-04 NOTE — Progress Notes (Signed)
This patient presents to the office with chief complaint of a painful fifth toe left foot.  Marland Kitchen  He says it is now causing pain and discomfort and causes him to remove his shoe at work.  He denies any history of trauma or injury to the foot.  He presents the office today for an evaluation and treatment of this painful fifth toe left foot  Patient is diagnosed with lymphedema.  Vascular  Dorsalis pedis and posterior tibial pulses are weakly  Palpable due to swelling   B/L.  Capillary return  WNL.  Temperature gradient is  WNL.  Skin turgor  WNL  Sensorium  Senn Weinstein monofilament wire  WNL. Normal tactile sensation.  Nail Exam  Patient has normal nails with no evidence of bacterial or fungal infection.  Orthopedic  Exam  Muscle tone and muscle strength  WNL.  No limitations of motion feet  B/L.  No crepitus or joint effusion noted.  Foot type is unremarkable and digits show no abnormalities.  Bone spur fifth toe left foot.  Skin  No open lesions.  Normal skin texture and turgor. Heloma durum fifth toe left foot.  Heloma durum fifth toe left foot.  Debride HD with # 15 blade.  Padding.  Discussed this condition with this patient.  RTC prn. Told patient to cut out hole in his shoes.  Bandaid applied fifth toe left foot.  Minimal bleeding.   Helane Gunther DPM

## 2019-07-22 DIAGNOSIS — Z20822 Contact with and (suspected) exposure to covid-19: Secondary | ICD-10-CM | POA: Diagnosis not present

## 2019-07-22 DIAGNOSIS — B349 Viral infection, unspecified: Secondary | ICD-10-CM | POA: Diagnosis not present

## 2019-09-18 ENCOUNTER — Other Ambulatory Visit: Payer: Self-pay

## 2019-09-18 DIAGNOSIS — E039 Hypothyroidism, unspecified: Secondary | ICD-10-CM

## 2019-09-18 MED ORDER — LEVOTHYROXINE SODIUM 88 MCG PO TABS: 88.0000 ug | ORAL_TABLET | Freq: Every day | ORAL | 0 refills | Status: DC

## 2019-10-01 ENCOUNTER — Ambulatory Visit (INDEPENDENT_AMBULATORY_CARE_PROVIDER_SITE_OTHER): Payer: BC Managed Care – PPO | Admitting: Nurse Practitioner

## 2019-10-01 ENCOUNTER — Other Ambulatory Visit: Payer: Self-pay

## 2019-10-01 ENCOUNTER — Encounter: Payer: Self-pay | Admitting: Nurse Practitioner

## 2019-10-01 VITALS — Resp 16 | Ht 68.0 in

## 2019-10-01 DIAGNOSIS — L03116 Cellulitis of left lower limb: Secondary | ICD-10-CM

## 2019-10-01 DIAGNOSIS — E039 Hypothyroidism, unspecified: Secondary | ICD-10-CM

## 2019-10-01 MED ORDER — LEVOFLOXACIN 500 MG PO TABS: 500.0000 mg | ORAL_TABLET | Freq: Every day | ORAL | 0 refills | Status: DC

## 2019-10-01 MED ORDER — LEVOTHYROXINE SODIUM 88 MCG PO TABS: 88.0000 ug | ORAL_TABLET | Freq: Every day | ORAL | 0 refills | Status: DC

## 2019-10-01 NOTE — Progress Notes (Signed)
[facility] [address] [place], [place] [address]  Internal MEDICINE  Telephone Visit  Patient Name: Isaiah Douglas  [id number]  [id number]  Date of Service: 10/01/2019  I connected with the patient at 1:02pm by telephone and verified the patients identity using two identifiers.   I discussed the limitations, risks, security and privacy concerns of performing an evaluation and management service by telephone and the availability of in person appointments. I also discussed with the patient that there may be a patient responsible charge related to the service.  The patient expressed understanding and agrees to proceed.    Chief Complaint  Patient presents with  . Telephone Assessment  . Telephone Screen  . Leg Pain    Swollen, finished antibiotic, but still swelling  . Quality Metric Gaps    Colonoscopy    The patient has been contacted via telephone for follow up visit due to concerns for spread of novel coronavirus. The patient presents for acute visit. He is having cellulitis in left lower leg. This is chronic and intermittent issue. He does take augmentin for this. Took his prescribed antibiotics which has helped, but swelling and redness are still present. He states that warmth and tenderness are resolved. He states that the last time symptoms were persistent like this, he did have to do a short term treatment with levofloxacin.       Current Medication: Outpatient Encounter Medications as of 10/01/2019  Medication Sig  . amoxicillin-clavulanate (AUGMENTIN) 875-125 MG tablet Take 1 tablet by mouth 2 (two) times daily.  Marland Kitchen aspirin EC 81 MG tablet Take 81 mg by mouth daily.  Marland Kitchen levofloxacin (LEVAQUIN) 500 MG tablet Take 1 tablet (500 mg total) by mouth daily.  . [DISCONTINUED] levofloxacin (LEVAQUIN) 500 MG tablet Take 1 tablet (500 mg total) by mouth daily.  . [DISCONTINUED] levothyroxine (SYNTHROID) 88 MCG tablet Take 1 tablet (88 mcg total) by mouth daily.   No  facility-administered encounter medications on file as of 10/01/2019.    Surgical History: Past Surgical History:  Procedure Laterality Date  . CHOLECYSTECTOMY, LAPAROSCOPIC    . GASTRIC BYPASS    . HERNIA REPAIR      Medical History: Past Medical History:  Diagnosis Date  . Hypothyroidism   . Lymphedema   . Morbid obesity due to excess calories ([facility])     Family History: Family History  Problem Relation Age of Onset  . Heart disease Mother   . Hyperlipidemia Mother   . Diabetes Mother   . Hypertension Mother     Social History   Socioeconomic History  . Marital status: Married    Spouse name: Not on file  . Number of children: Not on file  . Years of education: Not on file  . Highest education level: Not on file  Occupational History  . Not on file  Tobacco Use  . Smoking status: Never Smoker  . Smokeless tobacco: Never Used  Substance and Sexual Activity  . Alcohol use: Not Currently  . Drug use: Never  . Sexual activity: Not on file  Other Topics Concern  . Not on file  Social History Narrative  . Not on file   Social Determinants of Health   Financial Resource Strain:   . Difficulty of Paying Living Expenses:   Food Insecurity:   . Worried About Programme researcher, broadcasting/film/video in the Last Year:   . Barista in the Last Year:   Transportation Needs:   . Lack  of Transportation (Medical):   Marland Kitchen Lack of Transportation (Non-Medical):   Physical Activity:   . Days of Exercise per Week:   . Minutes of Exercise per Session:   Stress:   . Feeling of Stress :   Social Connections:   . Frequency of Communication with Friends and Family:   . Frequency of Social Gatherings with Friends and Family:   . Attends Religious Services:   . Active Member of Clubs or Organizations:   . Attends Banker Meetings:   Marland Kitchen Marital Status:   Intimate Partner Violence:   . Fear of Current or Ex-Partner:   . Emotionally Abused:   Marland Kitchen Physically Abused:   . Sexually  Abused:       Review of Systems  Constitutional: Negative for chills, fatigue and unexpected weight change.  HENT: Negative for congestion, postnasal drip, rhinorrhea, sneezing and sore throat.   Respiratory: Negative for cough, chest tightness and shortness of breath.   Cardiovascular: Positive for leg swelling. Negative for chest pain and palpitations.       Redness and swelling of left lower leg.   Gastrointestinal: Negative for abdominal pain, constipation, diarrhea, nausea and vomiting.  Musculoskeletal: Negative for arthralgias, back pain, joint swelling and neck pain.  Skin: Negative for rash.       Cellulitis of left lower leg. Red and swollen. Since completing augmentin, decreased tenderness and no longer warm.   Allergic/Immunologic: Negative for environmental allergies.  Neurological: Negative for dizziness, tremors, numbness and headaches.  Hematological: Negative for adenopathy. Does not bruise/bleed easily.  Psychiatric/Behavioral: Negative for behavioral problems (Depression), sleep disturbance and suicidal ideas. The patient is not nervous/anxious.    Today's Vitals   10/01/19 1159  Resp: 16  Height: 5\' 8"  (1.727 m)   Body mass index is 54.13 kg/m.  Observation/Objective:  The patient is alert and oriented. He is pleasant and answering all questions appropriately. Breathing is non-labored. He is in no acute distress.    Assessment/Plan: 1. Cellulitis of left lower extremity Not resolved since finishing augmentin. Will do round of levofloxacin 500mg  daily for 7 days. Patient to call office if no improvement over next few days.  - levofloxacin (LEVAQUIN) 500 MG tablet; Take 1 tablet (500 mg total) by mouth daily.  Dispense: 7 tablet; Refill: 0  General Counseling: Isaiah Douglas verbalizes understanding of the findings of today's phone visit and agrees with plan of treatment. I have discussed any further diagnostic evaluation that may be needed or ordered today. We also  reviewed his medications today. he has been encouraged to call the office with any questions or concerns that should arise related to todays visit.   This patient was seen by FNP Collaboration with Dr as a part of collaborative care agreement  Meds ordered this encounter  Medications  . DISCONTD: levofloxacin (LEVAQUIN) 500 MG tablet    Sig: Take 1 tablet (500 mg total) by mouth daily.    Dispense:  7 tablet    Refill:  0    Order Specific Question:   Supervising Provider    Answer:   Vincent Gros [[id number]]  . levofloxacin (LEVAQUIN) 500 MG tablet    Sig: Take 1 tablet (500 mg total) by mouth daily.    Dispense:  7 tablet    Refill:  0    Please fill at [facility], [place] [facility]    Order Specific Question:   Supervising Provider    Answer:   Lyndon Code [  [id number]]    Time spent: [place] [facility]

## 2019-12-09 DIAGNOSIS — Z20822 Contact with and (suspected) exposure to covid-19: Secondary | ICD-10-CM | POA: Diagnosis not present

## 2019-12-09 DIAGNOSIS — M7989 Other specified soft tissue disorders: Secondary | ICD-10-CM | POA: Diagnosis not present

## 2019-12-09 DIAGNOSIS — Z8679 Personal history of other diseases of the circulatory system: Secondary | ICD-10-CM | POA: Diagnosis not present

## 2019-12-09 DIAGNOSIS — Z9884 Bariatric surgery status: Secondary | ICD-10-CM | POA: Diagnosis not present

## 2019-12-09 DIAGNOSIS — R0600 Dyspnea, unspecified: Secondary | ICD-10-CM | POA: Diagnosis not present

## 2019-12-09 DIAGNOSIS — Z79890 Hormone replacement therapy (postmenopausal): Secondary | ICD-10-CM | POA: Diagnosis not present

## 2019-12-09 DIAGNOSIS — Z6841 Body Mass Index (BMI) 40.0 and over, adult: Secondary | ICD-10-CM | POA: Diagnosis not present

## 2019-12-09 DIAGNOSIS — R600 Localized edema: Secondary | ICD-10-CM | POA: Diagnosis not present

## 2019-12-09 DIAGNOSIS — E039 Hypothyroidism, unspecified: Secondary | ICD-10-CM | POA: Diagnosis not present

## 2019-12-09 DIAGNOSIS — R001 Bradycardia, unspecified: Secondary | ICD-10-CM | POA: Diagnosis not present

## 2019-12-09 DIAGNOSIS — E7800 Pure hypercholesterolemia, unspecified: Secondary | ICD-10-CM | POA: Diagnosis not present

## 2019-12-09 DIAGNOSIS — L539 Erythematous condition, unspecified: Secondary | ICD-10-CM | POA: Diagnosis not present

## 2019-12-09 DIAGNOSIS — L0390 Cellulitis, unspecified: Secondary | ICD-10-CM | POA: Diagnosis not present

## 2019-12-09 DIAGNOSIS — M1711 Unilateral primary osteoarthritis, right knee: Secondary | ICD-10-CM | POA: Diagnosis not present

## 2019-12-09 DIAGNOSIS — M79604 Pain in right leg: Secondary | ICD-10-CM | POA: Diagnosis not present

## 2019-12-09 DIAGNOSIS — I10 Essential (primary) hypertension: Secondary | ICD-10-CM | POA: Diagnosis not present

## 2019-12-09 DIAGNOSIS — L03115 Cellulitis of right lower limb: Secondary | ICD-10-CM | POA: Diagnosis not present

## 2019-12-09 DIAGNOSIS — I890 Lymphedema, not elsewhere classified: Secondary | ICD-10-CM | POA: Diagnosis not present

## 2019-12-09 DIAGNOSIS — E785 Hyperlipidemia, unspecified: Secondary | ICD-10-CM | POA: Diagnosis not present

## 2019-12-09 DIAGNOSIS — E875 Hyperkalemia: Secondary | ICD-10-CM | POA: Diagnosis not present

## 2019-12-09 DIAGNOSIS — Z7982 Long term (current) use of aspirin: Secondary | ICD-10-CM | POA: Diagnosis not present

## 2019-12-09 DIAGNOSIS — G4733 Obstructive sleep apnea (adult) (pediatric): Secondary | ICD-10-CM | POA: Diagnosis not present

## 2020-01-10 ENCOUNTER — Ambulatory Visit (INDEPENDENT_AMBULATORY_CARE_PROVIDER_SITE_OTHER): Payer: BC Managed Care – PPO | Admitting: Hospice and Palliative Medicine

## 2020-01-10 ENCOUNTER — Other Ambulatory Visit: Payer: Self-pay

## 2020-01-10 ENCOUNTER — Encounter: Payer: Self-pay | Admitting: Hospice and Palliative Medicine

## 2020-01-10 DIAGNOSIS — E039 Hypothyroidism, unspecified: Secondary | ICD-10-CM

## 2020-01-10 DIAGNOSIS — Z0001 Encounter for general adult medical examination with abnormal findings: Secondary | ICD-10-CM

## 2020-01-10 DIAGNOSIS — L03116 Cellulitis of left lower limb: Secondary | ICD-10-CM | POA: Diagnosis not present

## 2020-01-10 DIAGNOSIS — I890 Lymphedema, not elsewhere classified: Secondary | ICD-10-CM

## 2020-01-10 DIAGNOSIS — L03115 Cellulitis of right lower limb: Secondary | ICD-10-CM

## 2020-01-10 DIAGNOSIS — I10 Essential (primary) hypertension: Secondary | ICD-10-CM

## 2020-01-10 MED ORDER — AMOXICILLIN-POT CLAVULANATE 875-125 MG PO TABS: 1.0000 | ORAL_TABLET | Freq: Two times a day (BID) | ORAL | 2 refills | Status: DC

## 2020-01-10 MED ORDER — LEVOTHYROXINE SODIUM 88 MCG PO TABS: 88.0000 ug | ORAL_TABLET | Freq: Every day | ORAL | 0 refills | Status: DC

## 2020-01-10 MED ORDER — LISINOPRIL 10 MG PO TABS: 10.0000 mg | ORAL_TABLET | Freq: Every day | ORAL | 3 refills | Status: DC

## 2020-01-10 NOTE — Progress Notes (Signed)
[facility] [address] [place], [place] [address]  Internal MEDICINE  Office Visit Note  Patient Name: Isaiah Douglas  [id number]  [id number]  Date of Service: 01/13/2020  Chief Complaint  Patient presents with  . Annual Exam  . Neck Pain    left side under ear 2 weeks ago it started  . Leg Swelling    check legs, had infection 3-4 weeks ago  . controlled substance policy    acknowledged  . Quality Metric Gaps    Hep C screen, HIV screen, Tdap, colonoscopy, Flu vacc, Covid vacc     HPI Pt is here for routine health maintenance examination He says overall things have been going fair with his health He was recently hospitalized 9/13-9/15 at [facility] for bilateral lower leg lymphedema with cellulitis, treated with IV antibiotics Legs have healed, swelling is back down to his baseline--he is asking for refills of Augmentin as he takes this as needed when he feels an infection is starting in his legs as this is a recurrent issue with him He is also requesting refills on his other medications He is not interested in colonoscopy or ColoGuard testing at this time He does complain today of pain in his neck on the left side, the pain started a few days ago and has much improved, not causing him headaches, pain has not interfered with his work or sleep We discussed his elevated BP--was previously on HTN medications but has been off of them for quite some time, he is not against taking medication that he feels is necessary for his health  Current Medication: Outpatient Encounter Medications as of 01/10/2020  Medication Sig  . aspirin EC 81 MG tablet Take 81 mg by mouth daily.  . [DISCONTINUED] levothyroxine (SYNTHROID) 88 MCG tablet Take 1 tablet (88 mcg total) by mouth daily.  . [DISCONTINUED] levothyroxine (SYNTHROID) 88 MCG tablet Take 1 tablet (88 mcg total) by mouth daily.  . [DISCONTINUED] amoxicillin-clavulanate (AUGMENTIN) 875-125 MG tablet Take 1 tablet by mouth 2  (two) times daily. (Patient not taking: Reported on 01/10/2020)  . [DISCONTINUED] amoxicillin-clavulanate (AUGMENTIN) 875-125 MG tablet Take 1 tablet by mouth 2 (two) times daily.  . [DISCONTINUED] levofloxacin (LEVAQUIN) 500 MG tablet Take 1 tablet (500 mg total) by mouth daily. (Patient not taking: Reported on 01/10/2020)  . [DISCONTINUED] lisinopril (ZESTRIL) 10 MG tablet Take 1 tablet (10 mg total) by mouth daily.   No facility-administered encounter medications on file as of 01/10/2020.    Surgical History: Past Surgical History:  Procedure Laterality Date  . CHOLECYSTECTOMY, LAPAROSCOPIC    . GASTRIC BYPASS    . HERNIA REPAIR      Medical History: Past Medical History:  Diagnosis Date  . Hypothyroidism   . Lymphedema   . Morbid obesity due to excess calories ([facility])     Family History: Family History  Problem Relation Age of Onset  . Heart disease Mother   . Hyperlipidemia Mother   . Diabetes Mother   . Hypertension Mother       Review of Systems  Constitutional: Negative for chills, diaphoresis, fatigue and unexpected weight change.  HENT: Negative for congestion, postnasal drip, rhinorrhea, sneezing and sore throat.   Eyes: Negative for photophobia, redness and visual disturbance.  Respiratory: Negative for cough, chest tightness and shortness of breath.   Cardiovascular: Positive for leg swelling. Negative for chest pain and palpitations.       Bilateral lymphedema  Gastrointestinal: Negative for abdominal pain, constipation, diarrhea, nausea  and vomiting.  Genitourinary: Negative for dysuria and frequency.  Musculoskeletal: Positive for neck pain. Negative for arthralgias, back pain and joint swelling.  Skin: Negative for rash.  Neurological: Negative for dizziness, tremors, light-headedness, numbness and headaches.  Hematological: Negative for adenopathy. Does not bruise/bleed easily.  Psychiatric/Behavioral: Negative for behavioral problems (Depression),  sleep disturbance and suicidal ideas. The patient is not nervous/anxious.      Vital Signs: BP (!) 160/94   Pulse 70   Resp 16   Ht 5\' 8"  (1.727 m)   Wt (!) 378 lb 9.6 oz (171.7 kg)   SpO2 96%   BMI 57.57 kg/m    Physical Exam Vitals reviewed.  Constitutional:      Appearance: Normal appearance.  Cardiovascular:     Rate and Rhythm: Normal rate and regular rhythm.     Pulses: Normal pulses.     Heart sounds: Normal heart sounds.     Comments: Bilateral 3+ pitting edema--lymphedema, no redness or signs of infection Pulmonary:     Effort: Pulmonary effort is normal.     Breath sounds: Normal breath sounds.  Musculoskeletal:        General: Normal range of motion.     Cervical back: Normal range of motion.  Skin:    General: Skin is warm.  Neurological:     General: No focal deficit present.     Mental Status: He is alert and oriented to person, place, and time. Mental status is at baseline.  Psychiatric:        Mood and Affect: Mood normal.        Behavior: Behavior normal.        Thought Content: Thought content normal.    LABS: No results found for this or any previous visit (from the past 2160 hour(s)).  Assessment/Plan: 1. Encounter for routine adult health examination with abnormal findings Well appearing 54 year old male, annual labs ordered for review At this time he refuses colonoscopy--does not feel that it is necessary, discussed the importance of colon cancer screening - Lipid Panel With LDL/HDL Ratio - CBC w/Diff/Platelet - Comprehensive Metabolic Panel (CMET) - TSH + free T4 - PSA  2. Bilateral cellulitis of lower leg Acute flare of cellulitis resolved at this time. Will refill Augmentin for recurrent infections, he has had multiple cases of cellulitis in the past.  3. Lymphedema of both lower extremities Lymphedema appears at baseline today--discussed referral to lymphedema clinic, he declines at this time.  4. Acquired  hypothyroidism Requesting refills today as he only has 2 pills left, will review labs and adjust levothyroxine dose as indicated.  5. Essential hypertension BP elevated today and has been elevated at last couple of visits. Will start low dose lisinopril for BP elevation and will assess response to therapy at next visit.  General Counseling: [place] verbalizes understanding of the findings of todays visit and agrees with plan of treatment. I have discussed any further diagnostic evaluation that may be needed or ordered today. We also reviewed his medications today. he has been encouraged to call the office with any questions or concerns that should arise related to todays visit.    Counseling: Hypertension Counseling:   The following hypertensive lifestyle modification were recommended and discussed:  1. Limiting alcohol intake to less than 1 oz/day of ethanol:(24 oz of beer or 8 oz of wine or 2 oz of 100-proof whiskey). 2. Take baby ASA 81 mg daily. 3. Importance of regular aerobic exercise and losing weight. 4. Reduce  dietary saturated fat and cholesterol intake for overall cardiovascular health. 5. Maintaining adequate dietary potassium, calcium, and magnesium intake. 6. Regular monitoring of the blood pressure. 7. Reduce sodium intake to less than 100 mmol/day (less than 2.3 gm of sodium or less than 6 gm of sodium choride)    Orders Placed This Encounter  Procedures  . Lipid Panel With LDL/HDL Ratio  . CBC w/Diff/Platelet  . Comprehensive Metabolic Panel (CMET)  . TSH + free T4  . PSA    Meds ordered this encounter  Medications  . DISCONTD: lisinopril (ZESTRIL) 10 MG tablet    Sig: Take 1 tablet (10 mg total) by mouth daily.    Dispense:  90 tablet    Refill:  3  . DISCONTD: amoxicillin-clavulanate (AUGMENTIN) 875-125 MG tablet    Sig: Take 1 tablet by mouth 2 (two) times daily.    Dispense:  28 tablet    Refill:  2  . DISCONTD: levothyroxine (SYNTHROID) 88 MCG tablet     Sig: Take 1 tablet (88 mcg total) by mouth daily.    Dispense:  90 tablet    Refill:  0    Pt need appt for further refills    Total time spent: 30 Minutes  Time spent includes review of chart, medications, test results, and follow up plan with the patient.   This patient was seen by Brent General AGNP-C Collaboration with Dr Lyndon Code as a part of collaborative care agreement   Lubertha Basque. [facility] Internal Medicine

## 2020-01-13 ENCOUNTER — Encounter: Payer: Self-pay | Admitting: Hospice and Palliative Medicine

## 2020-01-15 DIAGNOSIS — Z0001 Encounter for general adult medical examination with abnormal findings: Secondary | ICD-10-CM | POA: Diagnosis not present

## 2020-01-16 LAB — COMPREHENSIVE METABOLIC PANEL
ALT: 13 IU/L (ref 0–44)
AST: 16 IU/L (ref 0–40)
Albumin/Globulin Ratio: 1.6 (ref 1.2–2.2)
Albumin: 3.9 g/dL (ref 3.8–4.9)
Alkaline Phosphatase: 59 IU/L (ref 44–121)
BUN/Creatinine Ratio: 15 (ref 9–20)
BUN: 12 mg/dL (ref 6–24)
Bilirubin Total: 0.4 mg/dL (ref 0.0–1.2)
CO2: 21 mmol/L (ref 20–29)
Calcium: 8.7 mg/dL (ref 8.7–10.2)
Chloride: 108 mmol/L — ABNORMAL HIGH (ref 96–106)
Creatinine, Ser: 0.81 mg/dL (ref 0.76–1.27)
GFR calc Af Amer: 115 mL/min/{1.73_m2} (ref >59)
GFR calc non Af Amer: 99 mL/min/{1.73_m2} (ref >59)
Globulin, Total: 2.5 g/dL (ref 1.5–4.5)
Glucose: 84 mg/dL (ref 65–99)
Potassium: 4.1 mmol/L (ref 3.5–5.2)
Sodium: 143 mmol/L (ref 134–144)
Total Protein: 6.4 g/dL (ref 6.0–8.5)

## 2020-01-16 LAB — CBC WITH DIFFERENTIAL/PLATELET
Basophils Absolute: 0.1 10*3/uL (ref 0.0–0.2)
Basos: 1 %
EOS (ABSOLUTE): 0.2 10*3/uL (ref 0.0–0.4)
Eos: 3 %
Hematocrit: 43 % (ref 37.5–51.0)
Hemoglobin: 14.2 g/dL (ref 13.0–17.7)
Immature Grans (Abs): 0 10*3/uL (ref 0.0–0.1)
Immature Granulocytes: 0 %
Lymphocytes Absolute: 1.9 10*3/uL (ref 0.7–3.1)
Lymphs: 33 %
MCH: 29.6 pg (ref 26.6–33.0)
MCHC: 33 g/dL (ref 31.5–35.7)
MCV: 90 fL (ref 79–97)
Monocytes Absolute: 0.6 10*3/uL (ref 0.1–0.9)
Monocytes: 10 %
Neutrophils Absolute: 3.2 10*3/uL (ref 1.4–7.0)
Neutrophils: 53 %
Platelets: 231 10*3/uL (ref 150–450)
RBC: 4.8 x10E6/uL (ref 4.14–5.80)
RDW: 12.8 % (ref 11.6–15.4)
WBC: 5.9 10*3/uL (ref 3.4–10.8)

## 2020-01-16 LAB — PSA: Prostate Specific Ag, Serum: 0.4 ng/mL (ref 0.0–4.0)

## 2020-01-16 LAB — LIPID PANEL WITH LDL/HDL RATIO
Cholesterol, Total: 165 mg/dL (ref 100–199)
HDL: 49 mg/dL (ref >39)
LDL Chol Calc (NIH): 95 mg/dL (ref 0–99)
LDL/HDL Ratio: 1.9 ratio (ref 0.0–3.6)
Triglycerides: 116 mg/dL (ref 0–149)
VLDL Cholesterol Cal: 21 mg/dL (ref 5–40)

## 2020-01-16 LAB — TSH+FREE T4
Free T4: 1.05 ng/dL (ref 0.82–1.77)
TSH: 6.02 u[IU]/mL — ABNORMAL HIGH (ref 0.450–4.500)

## 2020-01-20 ENCOUNTER — Other Ambulatory Visit: Payer: Self-pay | Admitting: Hospice and Palliative Medicine

## 2020-01-20 DIAGNOSIS — E039 Hypothyroidism, unspecified: Secondary | ICD-10-CM

## 2020-01-20 MED ORDER — LEVOTHYROXINE SODIUM 125 MCG PO TABS: 125.0000 ug | ORAL_TABLET | Freq: Every day | ORAL | 0 refills | Status: DC

## 2020-01-20 NOTE — Progress Notes (Signed)
Based on labs, increased dose of Synthroid to 125 mcg, will repeat thyroid panel in 2 months.

## 2020-01-21 ENCOUNTER — Telehealth: Payer: Self-pay

## 2020-01-21 NOTE — Telephone Encounter (Signed)
-----   Message from Theotis Burrow, NP sent at 01/20/2020  2:20 PM EDT ----- Please call patient and let him know I have sent in a new prescription of Synthroid at an increased dose of 125 mcg based on his lab results. He has an appt with me in December, I have already ordered labs to have his thyroid levels rechecked. Please tell him to have labs drawn about 1 week prior to our visit in December.  Thanks!

## 2020-01-21 NOTE — Telephone Encounter (Signed)
Called and spoke to pt and advised that he has a new increased dose of synthroid that was sent to pharmacy.  Also informed pt that he needs to have labs drawn a week before his appt with Ladona Ridgel in Dec.

## 2020-02-26 ENCOUNTER — Ambulatory Visit: Payer: BC Managed Care – PPO | Admitting: Hospice and Palliative Medicine

## 2020-03-19 ENCOUNTER — Ambulatory Visit: Payer: BC Managed Care – PPO | Admitting: Hospice and Palliative Medicine

## 2020-03-30 ENCOUNTER — Encounter: Payer: Self-pay | Admitting: Nurse Practitioner

## 2020-03-30 ENCOUNTER — Ambulatory Visit: Payer: BC Managed Care – PPO | Admitting: Nurse Practitioner

## 2020-03-30 DIAGNOSIS — R059 Cough, unspecified: Secondary | ICD-10-CM | POA: Diagnosis not present

## 2020-03-30 DIAGNOSIS — M542 Cervicalgia: Secondary | ICD-10-CM | POA: Insufficient documentation

## 2020-03-30 MED ORDER — CYCLOBENZAPRINE HCL 5 MG PO TABS: 5.0000 mg | ORAL_TABLET | Freq: Every evening | ORAL | 1 refills | Status: DC | PRN

## 2020-03-30 MED ORDER — METHYLPREDNISOLONE 4 MG PO TBPK
ORAL_TABLET | ORAL | 0 refills | Status: DC
Start: 2020-03-30 — End: 2020-08-04

## 2020-03-30 NOTE — Progress Notes (Signed)
[facility] [address] [place], [place] [address]  Internal MEDICINE  Telephone Visit  Patient Name: Isaiah Douglas  [id number]  [id number]  Date of Service: 03/30/2020  I connected with the patient at 12:52pm by telephone and verified the patients identity using two identifiers.   I discussed the limitations, risks, security and privacy concerns of performing an evaluation and management service by telephone and the availability of in person appointments. I also discussed with the patient that there may be a patient responsible charge related to the service.  The patient expressed understanding and agrees to proceed.    Chief Complaint  Patient presents with  . Cough  . Neck Pain    Left side of neck has been hurting for 2-3 days.    The patient has been contacted via telephone for follow up visit due to concerns for spread of novel coronavirus. The patient presents for sick visit. -left sided neck pain. Worse when he is active. When he is resting, pain is nearly non existent. Has been taking ibuprofen and rubbing bio freeze on effected area. Started acting up last week.  States that he did not ave injury or participate in any unusual activity which would cause neck pain.  -has dry, non-productive cough. Feels ok otherwise. Does not believe he has been exposed to COVID 19. Has been fully vaccinated. Has not had his booster yet.       Current Medication: Outpatient Encounter Medications as of 03/30/2020  Medication Sig  . amoxicillin-clavulanate (AUGMENTIN) 875-125 MG tablet Take 1 tablet by mouth 2 (two) times daily.  Marland Kitchen aspirin EC 81 MG tablet Take 81 mg by mouth daily.  . cyclobenzaprine (FLEXERIL) 5 MG tablet Take 1 tablet (5 mg total) by mouth at bedtime as needed for muscle spasms.  Marland Kitchen levothyroxine (SYNTHROID) 125 MCG tablet Take 1 tablet (125 mcg total) by mouth daily.  Marland Kitchen lisinopril (ZESTRIL) 10 MG tablet Take 1 tablet (10 mg total) by mouth daily.  .  methylPREDNISolone (MEDROL) 4 MG TBPK tablet Take by mouth as directed for 6 days   No facility-administered encounter medications on file as of 03/30/2020.    Surgical History: Past Surgical History:  Procedure Laterality Date  . CHOLECYSTECTOMY, LAPAROSCOPIC    . GASTRIC BYPASS    . HERNIA REPAIR      Medical History: Past Medical History:  Diagnosis Date  . Hypothyroidism   . Lymphedema   . Morbid obesity due to excess calories (HCC)     Family History: Family History  Problem Relation Age of Onset  . Heart disease Mother   . Hyperlipidemia Mother   . Diabetes Mother   . Hypertension Mother     Social History   Socioeconomic History  . Marital status: Married    Spouse name: Not on file  . Number of children: Not on file  . Years of education: Not on file  . Highest education level: Not on file  Occupational History  . Not on file  Tobacco Use  . Smoking status: Never Smoker  . Smokeless tobacco: Never Used  Substance and Sexual Activity  . Alcohol use: Yes    Comment: occational  . Drug use: Never  . Sexual activity: Not on file  Other Topics Concern  . Not on file  Social History Narrative  . Not on file   Social Determinants of Health   Financial Resource Strain: Not on file  Food Insecurity: Not on file  Transportation Needs: Not  on file  Physical Activity: Not on file  Stress: Not on file  Social Connections: Not on file  Intimate Partner Violence: Not on file      Review of Systems  Constitutional: Negative for activity change, chills, fatigue and unexpected weight change.  HENT: Negative for congestion, postnasal drip, rhinorrhea, sneezing and sore throat.   Eyes: Negative for redness.  Respiratory: Positive for cough. Negative for chest tightness and shortness of breath.   Cardiovascular: Negative for chest pain and palpitations.  Gastrointestinal: Negative for abdominal pain, constipation, diarrhea, nausea and vomiting.  Endocrine:  Negative for cold intolerance, heat intolerance, polydipsia and polyuria.  Musculoskeletal: Positive for myalgias and neck pain. Negative for arthralgias, back pain and joint swelling.  Skin: Negative for rash.  Allergic/Immunologic: Negative for environmental allergies.  Neurological: Negative for dizziness, tremors, numbness and headaches.  Hematological: Negative for adenopathy. Does not bruise/bleed easily.  Psychiatric/Behavioral: Negative for behavioral problems (Depression), sleep disturbance and suicidal ideas. The patient is not nervous/anxious.     Vital Signs: There were no vitals taken for this visit.   Observation/Objective:  The patient is alert and oriented. He is pleasant and answering all questions appropriately. Breathing is non-labored. He is in no acute distress.  The patient has dry, non productive cough noted.   Assessment/Plan: 1. Cervicalgia Continue with NSAID as needed and as indicated during the day. Gentle ROM exercises recommended. Flexeril 5mg  may be taken at night as needed to relieve muscle pain/spasms. Apply heat as needed to effected area. Start medrol dose pack to reduce inflammation.  - cyclobenzaprine (FLEXERIL) 5 MG tablet; Take 1 tablet (5 mg total) by mouth at bedtime as needed for muscle spasms.  Dispense: 30 tablet; Refill: 1 - methylPREDNISolone (MEDROL) 4 MG TBPK tablet; Take by mouth as directed for 6 days  Dispense: 21 tablet; Refill: 0  2. Cough OTC medication recommended as needed for cough. Patient to get COVID 19 test and notify of results when available. Will follow self isolation recommendations.   General Counseling: Armstead verbalizes understanding of the findings of today's phone visit and agrees with plan of treatment. I have discussed any further diagnostic evaluation that may be needed or ordered today. We also reviewed his medications today. he has been encouraged to call the office with any questions or concerns that should arise  related to todays visit.      Person Under Monitoring Name: Isaiah Douglas  Location: [address] [place]   Infection Prevention Recommendations for Individuals Confirmed to have, or Being Evaluated for, 2019 Novel Coronavirus (COVID-19) Infection Who Receive Care at Home  Individuals who are confirmed to have, or are being evaluated for, COVID-19 should follow the prevention steps below until a healthcare provider or local or state health department says they can return to normal activities.  Stay home except to get medical care You should restrict activities outside your home, except for getting medical care. Do not go to work, school, or public areas, and do not use public transportation or taxis.  Call ahead before visiting your doctor Before your medical appointment, call the healthcare provider and tell them that you have, or are being evaluated for, COVID-19 infection. This will help the healthcare provider's office take steps to keep other people from getting infected. Ask your healthcare provider to call the local or state health department.  Monitor your symptoms Seek prompt medical attention if your illness is worsening (e.g., difficulty breathing). Before going to your  medical appointment, call the healthcare provider and tell them that you have, or are being evaluated for, COVID-19 infection. Ask your healthcare provider to call the local or state health department.  Wear a facemask You should wear a facemask that covers your nose and mouth when you are in the same room with other people and when you visit a healthcare provider. People who live with or visit you should also wear a facemask while they are in the same room with you.  Separate yourself from other people in your home As much as possible, you should stay in a different room from other people in your home. Also, you should use a separate bathroom, if available.  Avoid sharing  household items You should not share dishes, drinking glasses, cups, eating utensils, towels, bedding, or other items with other people in your home. After using these items, you should wash them thoroughly with soap and water.  Cover your coughs and sneezes Cover your mouth and nose with a tissue when you cough or sneeze, or you can cough or sneeze into your sleeve. Throw used tissues in a lined trash can, and immediately wash your hands with soap and water for at least 20 seconds or use an alcohol-based hand rub.  Wash your [employer] your hands often and thoroughly with soap and water for at least 20 seconds. You can use an alcohol-based hand sanitizer if soap and water are not available and if your hands are not visibly dirty. Avoid touching your eyes, nose, and mouth with unwashed hands.   Prevention Steps for Caregivers and Household Members of Individuals Confirmed to have, or Being Evaluated for, COVID-19 Infection Being Cared for in the Home  If you live with, or provide care at home for, a person confirmed to have, or being evaluated for, COVID-19 infection please follow these guidelines to prevent infection:  Follow healthcare provider's instructions Make sure that you understand and can help the patient follow any healthcare provider instructions for all care.  Provide for the patient's basic needs You should help the patient with basic needs in the home and provide support for getting groceries, prescriptions, and other personal needs.  Monitor the patient's symptoms If they are getting sicker, call his or her medical provider and tell them that the patient has, or is being evaluated for, COVID-19 infection. This will help the healthcare provider's office take steps to keep other people from getting infected. Ask the healthcare provider to call the local or state health department.  Limit the number of people who have contact with the patient  If possible, have only  one caregiver for the patient.  Other household members should stay in another home or place of residence. If this is not possible, they should stay  in another room, or be separated from the patient as much as possible. Use a separate bathroom, if available.  Restrict visitors who do not have an essential need to be in the home.  Keep older adults, very young children, and other sick people away from the patient Keep older adults, very young children, and those who have compromised immune systems or chronic health conditions away from the patient. This includes people with chronic heart, lung, or kidney conditions, diabetes, and cancer.  Ensure good ventilation Make sure that shared spaces in the home have good air flow, such as from an air conditioner or an opened window, weather permitting.  Wash your hands often  Wash your hands often and thoroughly with  soap and water for at least 20 seconds. You can use an alcohol based hand sanitizer if soap and water are not available and if your hands are not visibly dirty.  Avoid touching your eyes, nose, and mouth with unwashed hands.  Use disposable paper towels to dry your hands. If not available, use dedicated cloth towels and replace them when they become wet.  Wear a facemask and gloves  Wear a disposable facemask at all times in the room and gloves when you touch or have contact with the patient's blood, body fluids, and/or secretions or excretions, such as sweat, saliva, sputum, nasal mucus, vomit, urine, or feces.  Ensure the mask fits over your nose and mouth tightly, and do not touch it during use.  Throw out disposable facemasks and gloves after using them. Do not reuse.  Wash your hands immediately after removing your facemask and gloves.  If your personal clothing becomes contaminated, carefully remove clothing and launder. Wash your hands after handling contaminated clothing.  Place all used disposable facemasks, gloves, and  other waste in a lined container before disposing them with other household waste.  Remove gloves and wash your hands immediately after handling these items.  Do not share dishes, glasses, or other household items with the patient  Avoid sharing household items. You should not share dishes, drinking glasses, cups, eating utensils, towels, bedding, or other items with a patient who is confirmed to have, or being evaluated for, COVID-19 infection.  After the person uses these items, you should wash them thoroughly with soap and water.  Wash laundry thoroughly  Immediately remove and wash clothes or bedding that have blood, body fluids, and/or secretions or excretions, such as sweat, saliva, sputum, nasal mucus, vomit, urine, or feces, on them.  Wear gloves when handling laundry from the patient.  Read and follow directions on labels of laundry or clothing items and detergent. In general, wash and dry with the warmest temperatures recommended on the label.  Clean all areas the individual has used often  Clean all touchable surfaces, such as counters, tabletops, doorknobs, bathroom fixtures, toilets, phones, keyboards, tablets, and bedside tables, every day. Also, clean any surfaces that may have blood, body fluids, and/or secretions or excretions on them.  Wear gloves when cleaning surfaces the patient has come in contact with.  Use a diluted bleach solution (e.g., dilute bleach with 1 part bleach and 10 parts water) or a household disinfectant with a label that says EPA-registered for coronaviruses. To make a bleach solution at home, add 1 tablespoon of bleach to 1 quart (4 cups) of water. For a larger supply, add  cup of bleach to 1 gallon (16 cups) of water.  Read labels of cleaning products and follow recommendations provided on product labels. Labels contain instructions for safe and effective use of the cleaning product including precautions you should take when applying the product,  such as wearing gloves or eye protection and making sure you have good ventilation during use of the product.  Remove gloves and wash hands immediately after cleaning.  Monitor yourself for signs and symptoms of illness Caregivers and household members are considered close contacts, should monitor their health, and will be asked to limit movement outside of the home to the extent possible. Follow the monitoring steps for close contacts listed on the symptom monitoring form.   ? If you have additional questions, contact your local health department or call the epidemiologist on call at [phone] (available 24/7). ? This guidance  is subject to change. For the most up-to-date guidance from [facility], please refer to their website: [email or web address].hu  This patient was seen by Vincent Gros FNP Collaboration with Dr Lyndon Code as a part of collaborative care agreement  Meds ordered this encounter  Medications  . cyclobenzaprine (FLEXERIL) 5 MG tablet    Sig: Take 1 tablet (5 mg total) by mouth at bedtime as needed for muscle spasms.    Dispense:  30 tablet    Refill:  1    Order Specific Question:   Supervising Provider    Answer:   Lyndon Code [[id number]]  . methylPREDNISolone (MEDROL) 4 MG TBPK tablet    Sig: Take by mouth as directed for 6 days    Dispense:  21 tablet    Refill:  0    Order Specific Question:   Supervising Provider    Answer:   Lyndon Code [[id number]]    Time spent: 41 Minutes    Dr Lyndon Code Internal medicine

## 2020-05-19 ENCOUNTER — Other Ambulatory Visit: Payer: Self-pay | Admitting: Hospice and Palliative Medicine

## 2020-08-04 ENCOUNTER — Ambulatory Visit: Payer: BC Managed Care – PPO | Admitting: Internal Medicine

## 2020-08-04 ENCOUNTER — Other Ambulatory Visit: Payer: Self-pay

## 2020-08-04 ENCOUNTER — Encounter: Payer: Self-pay | Admitting: Internal Medicine

## 2020-08-04 DIAGNOSIS — L03116 Cellulitis of left lower limb: Secondary | ICD-10-CM | POA: Diagnosis not present

## 2020-08-04 DIAGNOSIS — I890 Lymphedema, not elsewhere classified: Secondary | ICD-10-CM

## 2020-08-04 DIAGNOSIS — E039 Hypothyroidism, unspecified: Secondary | ICD-10-CM

## 2020-08-04 MED ORDER — DOXYCYCLINE HYCLATE 100 MG PO TABS: 100.0000 mg | ORAL_TABLET | Freq: Two times a day (BID) | ORAL | 1 refills | Status: DC

## 2020-08-04 NOTE — Progress Notes (Signed)
[facility] [address] [place], [place] [address]  Internal MEDICINE  Office Visit Note  Patient Name: Isaiah Douglas  [id number]  [id number]  Date of Service: 08/12/2020  Chief Complaint  Patient presents with  . Acute Visit    Left leg infection, started Saturday, redness, hot to touch before he took antibiotics, tender, swollen    HPI Patient is here for acute and sick visit.  He has a history of chronic lymphedema and cellulitis.  He is morbidly obese and he gets the lower extremity cellulitis episodes 3-4 times a year. Previous episodes like this he had to be hospitalized due to septicemia. Patient had a history of gastric bypass however initially he lost weight but at this time he is really not doing well with his weight control. Patient also has hypothyroidism and his TSH was elevated previously however he is on the same Synthroid dose. He promised that he will update his labs    Current Medication: Outpatient Encounter Medications as of 08/04/2020  Medication Sig  . amoxicillin-clavulanate (AUGMENTIN) 875-125 MG tablet Take 1 tablet by mouth 2 (two) times daily.  Marland Kitchen aspirin EC 81 MG tablet Take 81 mg by mouth daily.  Marland Kitchen doxycycline (VIBRA-TABS) 100 MG tablet Take 1 tablet (100 mg total) by mouth 2 (two) times daily.  Janann August 125 MCG tablet Take 1 tablet by mouth once daily  . lisinopril (ZESTRIL) 10 MG tablet Take 1 tablet (10 mg total) by mouth daily.  . [DISCONTINUED] cyclobenzaprine (FLEXERIL) 5 MG tablet Take 1 tablet (5 mg total) by mouth at bedtime as needed for muscle spasms. (Patient not taking: Reported on 08/04/2020)  . [DISCONTINUED] methylPREDNISolone (MEDROL) 4 MG TBPK tablet Take by mouth as directed for 6 days (Patient not taking: Reported on 08/04/2020)   No facility-administered encounter medications on file as of 08/04/2020.    Surgical History: Past Surgical History:  Procedure Laterality Date  . CHOLECYSTECTOMY, LAPAROSCOPIC    .  GASTRIC BYPASS    . HERNIA REPAIR      Medical History: Past Medical History:  Diagnosis Date  . Hypothyroidism   . Lymphedema   . Morbid obesity due to excess calories (HCC)     Family History: Family History  Problem Relation Age of Onset  . Heart disease Mother   . Hyperlipidemia Mother   . Diabetes Mother   . Hypertension Mother     Social History   Socioeconomic History  . Marital status: Married    Spouse name: Not on file  . Number of children: Not on file  . Years of education: Not on file  . Highest education level: Not on file  Occupational History  . Not on file  Tobacco Use  . Smoking status: Never Smoker  . Smokeless tobacco: Never Used  Substance and Sexual Activity  . Alcohol use: Yes    Comment: occational  . Drug use: Never  . Sexual activity: Not on file  Other Topics Concern  . Not on file  Social History Narrative  . Not on file   Social Determinants of Health   Financial Resource Strain: Not on file  Food Insecurity: Not on file  Transportation Needs: Not on file  Physical Activity: Not on file  Stress: Not on file  Social Connections: Not on file  Intimate Partner Violence: Not on file      Review of Systems  Constitutional: Negative for chills, fatigue and unexpected weight change.  HENT: Negative for congestion, rhinorrhea,  sneezing and sore throat.   Eyes: Negative for redness.  Respiratory: Negative for cough, chest tightness and shortness of breath.   Cardiovascular: Negative for chest pain and palpitations.  Gastrointestinal: Negative for abdominal pain, constipation, diarrhea, nausea and vomiting.  Genitourinary: Negative for dysuria and frequency.  Musculoskeletal: Negative for arthralgias, back pain, joint swelling and neck pain.  Skin: Positive for rash.  Neurological: Negative.  Negative for tremors and numbness.  Hematological: Negative for adenopathy. Does not bruise/bleed easily.  Psychiatric/Behavioral: Negative  for behavioral problems (Depression), sleep disturbance and suicidal ideas. The patient is not nervous/anxious.     Vital Signs: BP 133/63   Pulse 70   Temp (!) 97.2 F (36.2 C)   Resp 16   Ht 5' 7.5" (1.715 m)   Wt (!) 390 lb 3.2 oz (177 kg)   SpO2 98%   BMI 60.21 kg/m    Physical Exam Constitutional:      Appearance: Normal appearance.  HENT:     Head: Normocephalic and atraumatic.     Nose: Nose normal.     Mouth/Throat:     Mouth: Mucous membranes are moist.     Pharynx: No posterior oropharyngeal erythema.  Eyes:     Extraocular Movements: Extraocular movements intact.     Pupils: Pupils are equal, round, and reactive to light.  Cardiovascular:     Pulses: Normal pulses.     Heart sounds: Normal heart sounds.  Pulmonary:     Effort: Pulmonary effort is normal.     Breath sounds: Normal breath sounds.  Musculoskeletal:        General: Swelling present.  Skin:    Findings: Erythema and rash present.     Comments: Left leg and thigh are swollen with erythema evidence of cellulitis present  Neurological:     General: No focal deficit present.     Mental Status: He is alert.  Psychiatric:        Mood and Affect: Mood normal.        Behavior: Behavior normal.        Assessment/Plan: 1. Left leg cellulitis Start patient on doxycycline 100 mg twice a day for 10 days and then evaluate for further antibiotic therapy - doxycycline (VIBRA-TABS) 100 MG tablet; Take 1 tablet (100 mg total) by mouth 2 (two) times daily.  Dispense: 60 tablet; Refill: 1  2. Acquired hypothyroidism Continue Synthroid repeat his levels in adjust medication accordingly - TSH + free T4  3. Lymphedema Patient has extreme bilateral lymphedema, he was really doing well after losing weight however his struggling with his weight control at this time patient was instructed to have his labs done and can help him with dietary modifications and pharmacological agents and understand that  to help  him lose weight  General Counseling: [name] verbalizes understanding of the findings of todays visit and agrees with plan of treatment. I have discussed any further diagnostic evaluation that may be needed or ordered today. We also reviewed his medications today. he has been encouraged to call the office with any questions or concerns that should arise related to todays visit.    Orders Placed This Encounter  Procedures  . TSH + free T4    Meds ordered this encounter  Medications  . doxycycline (VIBRA-TABS) 100 MG tablet    Sig: Take 1 tablet (100 mg total) by mouth 2 (two) times daily.    Dispense:  60 tablet    Refill:  1    Total time  spent:30 Minutes Time spent includes review of chart, medications, test results, and follow up plan with the patient.   [place] Controlled Substance Database was reviewed by me.   Dr Lyndon Code Internal medicine

## 2020-08-05 LAB — TSH+FREE T4
Free T4: 1.28 ng/dL (ref 0.82–1.77)
TSH: 5.14 u[IU]/mL — ABNORMAL HIGH (ref 0.450–4.500)

## 2020-08-18 ENCOUNTER — Ambulatory Visit: Payer: BC Managed Care – PPO | Admitting: Nurse Practitioner

## 2020-08-18 ENCOUNTER — Encounter: Payer: Self-pay | Admitting: Nurse Practitioner

## 2020-08-18 ENCOUNTER — Other Ambulatory Visit: Payer: Self-pay

## 2020-08-18 VITALS — BP 140/76 | HR 59 | Temp 98.5°F | Resp 16 | Ht 68.0 in | Wt 395.6 lb

## 2020-08-18 DIAGNOSIS — I10 Essential (primary) hypertension: Secondary | ICD-10-CM | POA: Diagnosis not present

## 2020-08-18 DIAGNOSIS — E782 Mixed hyperlipidemia: Secondary | ICD-10-CM

## 2020-08-18 DIAGNOSIS — I890 Lymphedema, not elsewhere classified: Secondary | ICD-10-CM

## 2020-08-18 DIAGNOSIS — Z6841 Body Mass Index (BMI) 40.0 and over, adult: Secondary | ICD-10-CM | POA: Diagnosis not present

## 2020-08-18 DIAGNOSIS — L03116 Cellulitis of left lower limb: Secondary | ICD-10-CM | POA: Diagnosis not present

## 2020-08-18 DIAGNOSIS — E538 Deficiency of other specified B group vitamins: Secondary | ICD-10-CM

## 2020-08-18 DIAGNOSIS — E039 Hypothyroidism, unspecified: Secondary | ICD-10-CM | POA: Diagnosis not present

## 2020-08-18 MED ORDER — OZEMPIC (0.25 OR 0.5 MG/DOSE) 2 MG/1.5ML ~~LOC~~ SOPN: 0.2500 mg | PEN_INJECTOR | SUBCUTANEOUS | 0 refills | Status: DC

## 2020-08-18 MED ORDER — WEGOVY 0.25 MG/0.5ML ~~LOC~~ SOAJ: 0.2500 mg | SUBCUTANEOUS | 0 refills | Status: DC

## 2020-08-18 NOTE — Progress Notes (Signed)
[facility] [place], [place] [address]  Internal MEDICINE  Office Visit Note  Patient Name: Isaiah Douglas  [id number]  [id number]  Date of Service: 08/21/2020  Chief Complaint  Patient presents with  . Follow-up    Weight loss, leg infection fu    HPI  Isaiah Douglas presents for a follow-up visit regarding weight loss and leg infection.  He has a history of chronic lymphedema and cellulitis.  He is morbidly obese and gets lower extremity cellulitis episodes 3-4 times a year.  Previous episodes similar to the current resolving infection led to him being hospitalized due to septicemia.  He is finishing his course of antibiotics for the infection and he reports that it is getting better. -Did he also wants to discuss weight loss is currently is 395 pounds with a BMI of 60.15.  He has gained 5 pounds since May 10.  He has a history of gastric bypass surgery.  He initially lost weight after the surgery but gained some of it back and was unable to lose any more.  He reports that he is struggling with controlling his weight now.  His past medical history is significant for hypothyroidism.  His TSH level was elevated previously, however he is on the same dose of levothyroxine, it was not adjusted.  Current Medication: Outpatient Encounter Medications as of 08/18/2020  Medication Sig  . amoxicillin-clavulanate (AUGMENTIN) 875-125 MG tablet Take 1 tablet by mouth 2 (two) times daily.  Marland Kitchen aspirin EC 81 MG tablet Take 81 mg by mouth daily.  Marland Kitchen doxycycline (VIBRA-TABS) 100 MG tablet Take 1 tablet (100 mg total) by mouth 2 (two) times daily.  Arna Medici 125 MCG tablet Take 1 tablet by mouth once daily  . lisinopril (ZESTRIL) 10 MG tablet Take 1 tablet (10 mg total) by mouth daily.  Derrill Memo ON 09/15/2020] Semaglutide-Weight Management (WEGOVY) 0.25 MG/0.5ML SOAJ Inject 0.25 mg into the skin once a week.  . [DISCONTINUED] Semaglutide,0.25 or 0.5MG/DOS, (OZEMPIC, 0.25 OR 0.5 MG/DOSE,) 2  MG/1.5ML SOPN Inject 0.25 mg into the skin once a week.   No facility-administered encounter medications on file as of 08/18/2020.    Surgical History: Past Surgical History:  Procedure Laterality Date  . CHOLECYSTECTOMY, LAPAROSCOPIC    . GASTRIC BYPASS    . HERNIA REPAIR      Medical History: Past Medical History:  Diagnosis Date  . Hypothyroidism   . Lymphedema   . Morbid obesity due to excess calories ([name])     Family History: Family History  Problem Relation Age of Onset  . Heart disease Mother   . Hyperlipidemia Mother   . Diabetes Mother   . Hypertension Mother     Social History   Socioeconomic History  . Marital status: Married    Spouse name: Not on file  . Number of children: Not on file  . Years of education: Not on file  . Highest education level: Not on file  Occupational History  . Not on file  Tobacco Use  . Smoking status: Never Smoker  . Smokeless tobacco: Never Used  Substance and Sexual Activity  . Alcohol use: Yes    Comment: occational  . Drug use: Never  . Sexual activity: Not on file  Other Topics Concern  . Not on file  Social History Narrative  . Not on file   Social Determinants of Health   Financial Resource Strain: Not on file  Food Insecurity: Not on file  Transportation Needs:  Not on file  Physical Activity: Not on file  Stress: Not on file  Social Connections: Not on file  Intimate Partner Violence: Not on file      Review of Systems  Constitutional: Positive for activity change (is walking more) and unexpected weight change (keeps gaining weight). Negative for appetite change, chills and fatigue.  HENT: Negative for congestion, rhinorrhea, sneezing and sore throat.   Eyes: Negative for redness.  Respiratory: Negative for cough, chest tightness and shortness of breath.   Cardiovascular: Negative for chest pain and palpitations.  Gastrointestinal: Negative for abdominal pain, constipation, diarrhea, nausea and  vomiting.  Genitourinary: Negative for dysuria and frequency.  Musculoskeletal: Negative for arthralgias, back pain, joint swelling and neck pain.  Skin: Negative for rash.  Neurological: Negative.  Negative for tremors and numbness.  Hematological: Negative for adenopathy. Does not bruise/bleed easily.  Psychiatric/Behavioral: Negative for behavioral problems (Depression), sleep disturbance and suicidal ideas. The patient is not nervous/anxious.     Vital Signs: BP 140/76   Pulse (!) 59   Temp 98.5 F (36.9 C)   Resp 16   Ht _0  (1.727 m)   Wt (!) 395 lb 9.6 oz (179.4 kg)   SpO2 98%   BMI 60.15 kg/m    Physical Exam Vitals reviewed.  Constitutional:      General: He is not in acute distress.    Appearance: He is well-developed. He is morbidly obese. He is not ill-appearing or toxic-appearing.  HENT:     Head: Normocephalic and atraumatic.  Eyes:     Pupils: Pupils are equal, round, and reactive to light.  Cardiovascular:     Rate and Rhythm: Normal rate and regular rhythm.     Pulses: Normal pulses.     Heart sounds: Normal heart sounds.  Pulmonary:     Effort: Pulmonary effort is normal.     Breath sounds: Normal breath sounds.  Musculoskeletal:     Cervical back: Normal range of motion and neck supple.  Skin:    General: Skin is warm and dry.     Capillary Refill: Capillary refill takes less than 2 seconds.  Neurological:     Mental Status: He is alert and oriented to person, place, and time.  Psychiatric:        Mood and Affect: Mood normal.        Behavior: Behavior normal. Behavior is cooperative.     Assessment/Plan: 1. Morbid obesity with body mass index of 60.0-69.9 in adult [facility]) Deval is interested in weight loss.  The metabolic breath test was done in the office today.  He was provided with example diets of the 1600- and 1800-calorie diet plan.  Baseline labs are ordered.  4-week sample of Wegovy provided to patient.  Discussed with the patient  appropriate method of injection and suitable injection sites for the [facility] medication.  Patient acknowledges understanding of instructions.  Prescription for [facility] sent to his pharmacy so the process can be initiated to get prior authorization for the medication.  We will follow-up with patient in 4 weeks to see how he is managing his weight with the specific calorie plans and the weight loss medication. - [id number] - Metabolic Test - Semaglutide-Weight Management (WEGOVY) 0.25 MG/0.5ML SOAJ; Inject 0.25 mg into the skin once a week.  Dispense: 2 mL; Refill: 0  2. Left leg cellulitis Patient is finishing a course of antibiotics to treat left leg cellulitis.  It is improving and the infection is resolving.  Patient  instructed to continue the antibiotic course until there are no more doses.  3. Acquired hypothyroidism Patient has a previous diagnosis of hypothyroidism his last TSH was 5.14 on May 10 which is down from 6.0 to on January 15, 2020.  His T4 level is normal at 1.28.  He is currently taking levothyroxine 125 mcg daily.  Will recheck his TSH in 4 weeks.  Will adjust medication accordingly at that time.  4. Essential hypertension Marcellis has a history of hypertension he is currently on lisinopril 10 mg daily.  At the current time his blood pressure is controlled with this medication.  No changes in treatment regimen at this time.  5. B12 deficiency The patient has a history of vitamin B12 deficiency.  His levels have not been checked recently.  CBC, B12 and folate panel ordered to rule out B12 deficiency and anemia. - CBC with Differential/Platelet - B12 and Folate Panel  6. Mixed hyperlipidemia Patient has a history of hyperlipidemia.  He has not had a recent lipid panel drawn.  Lipid profile has been ordered.  He is not currently on any medications to decrease his cholesterol levels. - Lipid Profile  7. Lymphedema Patient has extreme bilateral lymphedema, he was really doing well  after losing weight however his struggling with his weight control at this time patient was instructed to have his labs done and can help him with dietary modifications and pharmacological agents and understand that  to help him lose weight  General Counseling: Karon verbalizes understanding of the findings of todays visit and agrees with plan of treatment. I have discussed any further diagnostic evaluation that may be needed or ordered today. We also reviewed his medications today. he has been encouraged to call the office with any questions or concerns that should arise related to todays visit.    Orders Placed This Encounter  Procedures  . Metabolic Test  . BWL89+HTDS  . CBC with Differential/Platelet  . B12 and Folate Panel  . Lipid Profile    Meds ordered this encounter  Medications  . DISCONTD: Semaglutide,0.25 or 0.5MG/DOS, (OZEMPIC, 0.25 OR 0.5 MG/DOSE,) 2 MG/1.5ML SOPN    Sig: Inject 0.25 mg into the skin once a week.    Dispense:  1.5 mL    Refill:  0  . Semaglutide-Weight Management (WEGOVY) 0.25 MG/0.5ML SOAJ    Sig: Inject 0.25 mg into the skin once a week.    Dispense:  2 mL    Refill:  0    May need prior authorization, patient was given samples to last 4 weeks. Please fill prescription on 09/15/20   Return in about 4 weeks (around 09/15/2020) for F/U, Weight loss, Romari Gasparro PCP.  Total time spent:30 Minutes Time spent includes review of chart, medications, test results, and follow up plan with the patient.   [place] Controlled Substance Database was reviewed by me.  This patient was seen by Jonetta Osgood, FNP-C in collaboration with Dr. Clayborn Bigness as a part of collaborative care agreement.   Dr Lavera Guise Internal medicine

## 2020-09-09 ENCOUNTER — Other Ambulatory Visit: Payer: Self-pay | Admitting: Internal Medicine

## 2020-09-10 DIAGNOSIS — Z6841 Body Mass Index (BMI) 40.0 and over, adult: Secondary | ICD-10-CM | POA: Diagnosis not present

## 2020-09-10 DIAGNOSIS — E782 Mixed hyperlipidemia: Secondary | ICD-10-CM | POA: Diagnosis not present

## 2020-09-10 DIAGNOSIS — E538 Deficiency of other specified B group vitamins: Secondary | ICD-10-CM | POA: Diagnosis not present

## 2020-09-11 LAB — B12 AND FOLATE PANEL
Folate: 7 ng/mL (ref >3.0)
Vitamin B-12: 154 pg/mL — ABNORMAL LOW (ref 232–1245)

## 2020-09-11 LAB — CMP14+EGFR
ALT: 13 IU/L (ref 0–44)
AST: 15 IU/L (ref 0–40)
Albumin/Globulin Ratio: 1.5 (ref 1.2–2.2)
Albumin: 3.7 g/dL — ABNORMAL LOW (ref 3.8–4.9)
Alkaline Phosphatase: 62 IU/L (ref 44–121)
BUN/Creatinine Ratio: 15 (ref 9–20)
BUN: 14 mg/dL (ref 6–24)
Bilirubin Total: 0.4 mg/dL (ref 0.0–1.2)
CO2: 19 mmol/L — ABNORMAL LOW (ref 20–29)
Calcium: 8.6 mg/dL — ABNORMAL LOW (ref 8.7–10.2)
Chloride: 108 mmol/L — ABNORMAL HIGH (ref 96–106)
Creatinine, Ser: 0.94 mg/dL (ref 0.76–1.27)
Globulin, Total: 2.4 g/dL (ref 1.5–4.5)
Glucose: 87 mg/dL (ref 65–99)
Potassium: 4.6 mmol/L (ref 3.5–5.2)
Sodium: 142 mmol/L (ref 134–144)
Total Protein: 6.1 g/dL (ref 6.0–8.5)
eGFR: 95 mL/min/{1.73_m2} (ref >59)

## 2020-09-11 LAB — LIPID PANEL
Chol/HDL Ratio: 3.1 ratio (ref 0.0–5.0)
Cholesterol, Total: 165 mg/dL (ref 100–199)
HDL: 53 mg/dL (ref >39)
LDL Chol Calc (NIH): 91 mg/dL (ref 0–99)
Triglycerides: 118 mg/dL (ref 0–149)
VLDL Cholesterol Cal: 21 mg/dL (ref 5–40)

## 2020-09-11 LAB — CBC WITH DIFFERENTIAL/PLATELET
Basophils Absolute: 0.1 10*3/uL (ref 0.0–0.2)
Basos: 1 %
EOS (ABSOLUTE): 0.2 10*3/uL (ref 0.0–0.4)
Eos: 3 %
Hematocrit: 41.6 % (ref 37.5–51.0)
Hemoglobin: 14.2 g/dL (ref 13.0–17.7)
Immature Grans (Abs): 0 10*3/uL (ref 0.0–0.1)
Immature Granulocytes: 0 %
Lymphocytes Absolute: 2.4 10*3/uL (ref 0.7–3.1)
Lymphs: 34 %
MCH: 30.5 pg (ref 26.6–33.0)
MCHC: 34.1 g/dL (ref 31.5–35.7)
MCV: 89 fL (ref 79–97)
Monocytes Absolute: 0.5 10*3/uL (ref 0.1–0.9)
Monocytes: 7 %
Neutrophils Absolute: 3.7 10*3/uL (ref 1.4–7.0)
Neutrophils: 55 %
Platelets: 221 10*3/uL (ref 150–450)
RBC: 4.66 x10E6/uL (ref 4.14–5.80)
RDW: 12.6 % (ref 11.6–15.4)
WBC: 6.9 10*3/uL (ref 3.4–10.8)

## 2020-09-15 ENCOUNTER — Other Ambulatory Visit: Payer: Self-pay

## 2020-09-15 ENCOUNTER — Ambulatory Visit: Payer: BC Managed Care – PPO | Admitting: Nurse Practitioner

## 2020-09-15 ENCOUNTER — Encounter: Payer: Self-pay | Admitting: Nurse Practitioner

## 2020-09-15 VITALS — BP 142/70 | HR 60 | Temp 98.4°F | Resp 16 | Ht 68.0 in | Wt 390.8 lb

## 2020-09-15 DIAGNOSIS — Z6841 Body Mass Index (BMI) 40.0 and over, adult: Secondary | ICD-10-CM | POA: Diagnosis not present

## 2020-09-15 DIAGNOSIS — E039 Hypothyroidism, unspecified: Secondary | ICD-10-CM

## 2020-09-15 DIAGNOSIS — E538 Deficiency of other specified B group vitamins: Secondary | ICD-10-CM | POA: Diagnosis not present

## 2020-09-15 MED ORDER — CYANOCOBALAMIN 1000 MCG/ML IJ SOLN
1000.0000 ug | Freq: Once | INTRAMUSCULAR | Status: AC
  Administered 2020-09-16: 1000 ug via INTRAMUSCULAR

## 2020-09-15 NOTE — Progress Notes (Signed)
[facility] [address] [place], [place] [address]  Internal MEDICINE  Office Visit Note  Patient Name: Isaiah Douglas  [id number]  [id number]  Date of Service: 09/26/2020  Chief Complaint  Patient presents with   Follow-up    Lab review    HPI Dekota presents for follow up to review labs and discuss weight loss. As of today, Krithik has lost 5 lbs since he started taking Wegovy injections. The prior authorization has not been done yet, prescription resent, and PA requested from the pharmacy.  Calcium was slightly low, will monitor. Lipid panel was normal. Folate level was normal B12 level was low at 154.  CBC was normal Last TSH was elevated in may at 5.14    Current Medication: Outpatient Encounter Medications as of 09/15/2020  Medication Sig   amoxicillin-clavulanate (AUGMENTIN) 875-125 MG tablet Take 1 tablet by mouth 2 (two) times daily.   aspirin EC 81 MG tablet Take 81 mg by mouth daily.   doxycycline (VIBRA-TABS) 100 MG tablet Take 1 tablet (100 mg total) by mouth 2 (two) times daily.   EUTHYROX 125 MCG tablet Take 1 tablet by mouth once daily   lisinopril (ZESTRIL) 10 MG tablet Take 1 tablet (10 mg total) by mouth daily.   [DISCONTINUED] Semaglutide-Weight Management (WEGOVY) 0.25 MG/0.5ML SOAJ Inject 0.25 mg into the skin once a week.   Semaglutide-Weight Management (WEGOVY) 0.25 MG/0.5ML SOAJ Inject 0.25 mg into the skin once a week.   [EXPIRED] cyanocobalamin ((VITAMIN B-12)) injection 1,000 mcg    No facility-administered encounter medications on file as of 09/15/2020.    Surgical History: Past Surgical History:  Procedure Laterality Date   CHOLECYSTECTOMY, LAPAROSCOPIC     GASTRIC BYPASS     HERNIA REPAIR      Medical History: Past Medical History:  Diagnosis Date   Hypothyroidism    Lymphedema    Morbid obesity due to excess calories ([facility])     Family History: Family History  Problem Relation Age of Onset   Heart disease Mother     Hyperlipidemia Mother    Diabetes Mother    Hypertension Mother     Social History   Socioeconomic History   Marital status: Married    Spouse name: Not on file   Number of children: Not on file   Years of education: Not on file   Highest education level: Not on file  Occupational History   Not on file  Tobacco Use   Smoking status: Never   Smokeless tobacco: Never  Substance and Sexual Activity   Alcohol use: Yes    Comment: occational   Drug use: Never   Sexual activity: Not on file  Other Topics Concern   Not on file  Social History Narrative   Not on file   Social Determinants of Health   Financial Resource Strain: Not on file  Food Insecurity: Not on file  Transportation Needs: Not on file  Physical Activity: Not on file  Stress: Not on file  Social Connections: Not on file  Intimate Partner Violence: Not on file      Review of Systems  Constitutional:  Positive for activity change (is walking more) and fatigue. Negative for appetite change and chills. Unexpected weight change: keeps gaining weight. HENT:  Negative for congestion, rhinorrhea, sneezing and sore throat.   Eyes:  Negative for redness.  Respiratory:  Negative for cough, chest tightness and shortness of breath.   Cardiovascular:  Negative for chest pain and palpitations.  Gastrointestinal:  Negative for abdominal pain, constipation, diarrhea, nausea and vomiting.  Genitourinary:  Negative for dysuria and frequency.  Musculoskeletal:  Negative for arthralgias, back pain, joint swelling and neck pain.  Skin:  Negative for rash.  Neurological: Negative.  Negative for tremors and numbness.  Hematological:  Negative for adenopathy. Does not bruise/bleed easily.  Psychiatric/Behavioral:  Negative for behavioral problems (Depression), sleep disturbance and suicidal ideas. The patient is not nervous/anxious.    Vital Signs: BP (!) 142/70   Pulse 60   Temp 98.4 F (36.9 C)   Resp 16   Ht 5\' 8"   (1.727 m)   Wt (!) 390 lb 12.8 oz (177.3 kg)   SpO2 96%   BMI 59.42 kg/m    Physical Exam Vitals reviewed.  Constitutional:      General: He is not in acute distress.    Appearance: Normal appearance. He is well-developed. He is morbidly obese. He is not ill-appearing or toxic-appearing.  HENT:     Head: Normocephalic and atraumatic.  Eyes:     Pupils: Pupils are equal, round, and reactive to light.  Cardiovascular:     Rate and Rhythm: Normal rate and regular rhythm.     Pulses: Normal pulses.     Heart sounds: Normal heart sounds.  Pulmonary:     Effort: Pulmonary effort is normal.     Breath sounds: Normal breath sounds.  Musculoskeletal:     Cervical back: Normal range of motion and neck supple.  Skin:    General: Skin is warm and dry.     Capillary Refill: Capillary refill takes less than 2 seconds.  Neurological:     Mental Status: He is alert and oriented to person, place, and time.  Psychiatric:        Mood and Affect: Mood normal.        Behavior: Behavior normal. Behavior is cooperative.     Assessment/Plan: 1. B12 deficiency First B12 injection administered in office today. Prescription sent to pharmacy so patient can administered his own injections. B12 injections will be once weekly x3 weeks and then qmonthly.  - cyanocobalamin ((VITAMIN B-12)) injection 1,000 mcg  2. Morbid obesity with body mass index of 60.0-69.9 in adult [facility]) Wegovy reordered, still waiting on prior authorization.  - Semaglutide-Weight Management (WEGOVY) 0.25 MG/0.5ML SOAJ; Inject 0.25 mg into the skin once a week.  Dispense: 2 mL; Refill: 0  3. Acquired hypothyroidism Recheck TSH and T4.  - TSH + free T4   General Counseling: Andrius verbalizes understanding of the findings of todays visit and agrees with plan of treatment. I have discussed any further diagnostic evaluation that may be needed or ordered today. We also reviewed his medications today. he has been encouraged to call  the office with any questions or concerns that should arise related to todays visit.    Orders Placed This Encounter  Procedures   TSH + free T4    Meds ordered this encounter  Medications   cyanocobalamin ((VITAMIN B-12)) injection 1,000 mcg   Semaglutide-Weight Management (WEGOVY) 0.25 MG/0.5ML SOAJ    Sig: Inject 0.25 mg into the skin once a week.    Dispense:  2 mL    Refill:  0    Please send prior authorization to [facility]    Return in about 2 months (around 11/15/2020) for F/U, Weight loss, Azarria Balint PCP.   Total time spent:30 Minutes Time spent includes review of chart, medications, test results, and follow up plan with the patient.  [place] Controlled Substance Database was reviewed by me.  This patient was seen by Jonetta Osgood, FNP-C in collaboration with Dr. Clayborn Bigness as a part of collaborative care agreement.   Itha Kroeker R. Valetta Fuller, MSN, FNP-C Internal medicine

## 2020-09-21 ENCOUNTER — Other Ambulatory Visit: Payer: Self-pay

## 2020-09-21 MED ORDER — "SYRINGE 20G X 1"" 3 ML MISC": 3 refills | Status: DC

## 2020-09-21 MED ORDER — CYANOCOBALAMIN 1000 MCG/ML IJ SOLN: INTRAMUSCULAR | 3 refills | Status: DC

## 2020-09-22 ENCOUNTER — Ambulatory Visit: Payer: BC Managed Care – PPO

## 2020-09-26 MED ORDER — WEGOVY 0.25 MG/0.5ML ~~LOC~~ SOAJ: 0.2500 mg | SUBCUTANEOUS | 0 refills | Status: DC

## 2020-09-29 ENCOUNTER — Ambulatory Visit: Payer: BC Managed Care – PPO

## 2020-09-30 ENCOUNTER — Other Ambulatory Visit: Payer: Self-pay

## 2020-09-30 MED ORDER — LEVOTHYROXINE SODIUM 50 MCG PO TABS: 50.0000 ug | ORAL_TABLET | Freq: Every day | ORAL | 3 refills | Status: DC

## 2020-09-30 NOTE — Telephone Encounter (Signed)
Cancelled synthroid 50 MCG, called pharmacy to cancel prescription.  I notified pt and advised him to cut his thyroid medication 125 MCG in half and take both half's to get better absorption per DFK.

## 2020-09-30 NOTE — Telephone Encounter (Signed)
Spoke to pt and informed him that we sent synthroid 50 MCG to his pharmacy and to hold off on his lab work for the next 6 weeks.

## 2020-10-06 ENCOUNTER — Ambulatory Visit: Payer: BC Managed Care – PPO

## 2020-11-16 ENCOUNTER — Ambulatory Visit: Payer: BC Managed Care – PPO | Admitting: Nurse Practitioner

## 2020-11-27 ENCOUNTER — Encounter: Payer: Self-pay | Admitting: Nurse Practitioner

## 2020-11-27 ENCOUNTER — Ambulatory Visit: Payer: BC Managed Care – PPO | Admitting: Nurse Practitioner

## 2020-11-27 ENCOUNTER — Other Ambulatory Visit: Payer: Self-pay

## 2020-11-27 VITALS — BP 163/82 | HR 62 | Temp 98.5°F | Resp 16 | Ht 68.0 in | Wt 389.2 lb

## 2020-11-27 DIAGNOSIS — E538 Deficiency of other specified B group vitamins: Secondary | ICD-10-CM | POA: Diagnosis not present

## 2020-11-27 DIAGNOSIS — L03116 Cellulitis of left lower limb: Secondary | ICD-10-CM | POA: Diagnosis not present

## 2020-11-27 DIAGNOSIS — I10 Essential (primary) hypertension: Secondary | ICD-10-CM

## 2020-11-27 MED ORDER — "SYRINGE 20G X 1"" 3 ML MISC": 3 refills | Status: DC

## 2020-11-27 MED ORDER — AMOXICILLIN-POT CLAVULANATE 875-125 MG PO TABS: 1.0000 | ORAL_TABLET | Freq: Two times a day (BID) | ORAL | 2 refills | Status: DC

## 2020-11-27 MED ORDER — DOXYCYCLINE HYCLATE 100 MG PO TABS: 100.0000 mg | ORAL_TABLET | Freq: Two times a day (BID) | ORAL | 1 refills | Status: DC

## 2020-11-27 NOTE — Progress Notes (Signed)
[facility] [address] [place], [place] [address]  Internal MEDICINE  Office Visit Note  Patient Name: Isaiah Douglas  [id number]  [id number]  Date of Service: 11/27/2020  Chief Complaint  Patient presents with   Acute Visit    Refills    Cellulitis    Right leg, flaired up this week Monday/Tuesday morning     HPI Isaiah Douglas presents for an acute sick visit for cellulitis of the right leg. He gets recurrent cellulitis infections in his lower extremities. He usually has an antibiotic on hand to take at home when this happens but he did not have any available.  -he also needs needles and syringes for his B12 injections.     Current Medication:  Outpatient Encounter Medications as of 11/27/2020  Medication Sig   aspirin EC 81 MG tablet Take 81 mg by mouth daily.   cyanocobalamin (,VITAMIN B-12,) 1000 MCG/ML injection use as directed once a week for 2 weeks and then once a month   EUTHYROX 125 MCG tablet Take 1 tablet by mouth once daily   lisinopril (ZESTRIL) 10 MG tablet Take 1 tablet (10 mg total) by mouth daily.   Semaglutide-Weight Management (WEGOVY) 0.25 MG/0.5ML SOAJ Inject 0.25 mg into the skin once a week.   [DISCONTINUED] amoxicillin-clavulanate (AUGMENTIN) 875-125 MG tablet Take 1 tablet by mouth 2 (two) times daily.   [DISCONTINUED] doxycycline (VIBRA-TABS) 100 MG tablet Take 1 tablet (100 mg total) by mouth 2 (two) times daily.   [DISCONTINUED] Syringe/Needle, Disp, (SYRINGE 3CC/20GX1") 20G X 1" 3 ML MISC Use as directed with b12 injection once a week for 2 weeks and then once a week   amoxicillin-clavulanate (AUGMENTIN) 875-125 MG tablet Take 1 tablet by mouth 2 (two) times daily.   doxycycline (VIBRA-TABS) 100 MG tablet Take 1 tablet (100 mg total) by mouth 2 (two) times daily.   Syringe/Needle, Disp, (SYRINGE 3CC/20GX1") 20G X 1" 3 ML MISC Use as directed with b12 injection once a week for 2 weeks and then once a week   No facility-administered encounter  medications on file as of 11/27/2020.      Medical History: Past Medical History:  Diagnosis Date   Hypothyroidism    Lymphedema    Morbid obesity due to excess calories ([facility])      Vital Signs: BP (!) 163/82   Pulse 62   Temp 98.5 F (36.9 C)   Resp 16   Ht 5\' 8"  (1.727 m)   Wt (!) 389 lb 3.2 oz (176.5 kg)   SpO2 98%   BMI 59.18 kg/m    Review of Systems  Constitutional:  Positive for chills, diaphoresis and fever. Negative for fatigue.  Respiratory: Negative.  Negative for cough, chest tightness, shortness of breath and wheezing.   Cardiovascular: Negative.  Negative for chest pain and palpitations.  Gastrointestinal: Negative.   Genitourinary: Negative.   Musculoskeletal:  Negative for arthralgias.  Skin:  Positive for color change and wound.       Red swollen right leg, small dark scab from front of lower right leg.   Physical Exam Vitals reviewed.  Constitutional:      General: He is not in acute distress.    Appearance: Normal appearance. He is obese.  HENT:     Head: Normocephalic and atraumatic.  Eyes:     Extraocular Movements: Extraocular movements intact.     Pupils: Pupils are equal, round, and reactive to light.  Cardiovascular:     Rate and Rhythm: Normal  rate and regular rhythm.  Pulmonary:     Effort: Pulmonary effort is normal. No respiratory distress.  Musculoskeletal:        General: Swelling and tenderness present.     Right lower leg: 3+ Pitting Edema present.  Skin:    General: Skin is warm and dry.     Capillary Refill: Capillary refill takes less than 2 seconds.     Findings: Erythema present.  Neurological:     Mental Status: He is alert and oriented to person, place, and time.     Cranial Nerves: No cranial nerve deficit.     Coordination: Coordination normal.     Gait: Gait normal.  Psychiatric:        Mood and Affect: Mood normal.        Behavior: Behavior normal.      Assessment/Plan: 1. Left leg cellulitis Doxycycline  prescribed for the patient to start now. Take 1 tablet twice daily for 2 weeks and then continue to take 1 tablet every other day until the infection is gone. Patient usually has a prescription for augmentin in the house for when this type of infection happens. Augmentin prescribed for possible future flare up - doxycycline (VIBRA-TABS) 100 MG tablet; Take 1 tablet (100 mg total) by mouth 2 (two) times daily.  Dispense: 60 tablet; Refill: 1 - amoxicillin-clavulanate (AUGMENTIN) 875-125 MG tablet; Take 1 tablet by mouth 2 (two) times daily.  Dispense: 28 tablet; Refill: 2  2. B12 deficiency Syringe and needle refill sent to pharmacy - Syringe/Needle, Disp, (SYRINGE 3CC/20GX1") 20G X 1" 3 ML MISC; Use as directed with b12 injection once a week for 2 weeks and then once a week  Dispense: 3 each; Refill: 3  3. Essential hypertension Blood pressure is elevated today,patient is in pain with cellulitis infection. Will reevaluate blood pressure at next office visit scheduled 01/12/21.   General Counseling: Isaiah Douglas verbalizes understanding of the findings of todays visit and agrees with plan of treatment. I have discussed any further diagnostic evaluation that may be needed or ordered today. We also reviewed his medications today. he has been encouraged to call the office with any questions or concerns that should arise related to todays visit.  No orders of the defined types were placed in this encounter.   Meds ordered this encounter  Medications   doxycycline (VIBRA-TABS) 100 MG tablet    Sig: Take 1 tablet (100 mg total) by mouth 2 (two) times daily.    Dispense:  60 tablet    Refill:  1   Syringe/Needle, Disp, (SYRINGE 3CC/20GX1") 20G X 1" 3 ML MISC    Sig: Use as directed with b12 injection once a week for 2 weeks and then once a week    Dispense:  3 each    Refill:  3   amoxicillin-clavulanate (AUGMENTIN) 875-125 MG tablet    Sig: Take 1 tablet by mouth 2 (two) times daily.    Dispense:  28  tablet    Refill:  2    Return if symptoms worsen or fail to improve.  [place] Controlled Substance Database was reviewed by me for overdose risk score (ORS)  Time spent:20 Minutes Time spent with patient included reviewing progress notes, labs, imaging studies, and discussing plan for follow up.   This patient was seen by Sallyanne Kuster, FNP-C in collaboration with Dr. Beverely Risen as a part of collaborative care agreement.  Justyna Timoney R. Tedd Sias, MSN, FNP-C Internal Medicine

## 2021-01-12 ENCOUNTER — Encounter: Payer: BC Managed Care – PPO | Admitting: Nurse Practitioner

## 2021-02-15 ENCOUNTER — Other Ambulatory Visit: Payer: Self-pay

## 2021-02-15 ENCOUNTER — Other Ambulatory Visit: Payer: Self-pay | Admitting: Internal Medicine

## 2021-02-15 MED ORDER — LISINOPRIL 10 MG PO TABS: 10.0000 mg | ORAL_TABLET | Freq: Every day | ORAL | 0 refills | Status: DC

## 2021-02-16 ENCOUNTER — Other Ambulatory Visit: Payer: Self-pay

## 2021-04-02 ENCOUNTER — Encounter: Payer: BC Managed Care – PPO | Admitting: Nurse Practitioner

## 2021-05-11 ENCOUNTER — Ambulatory Visit (INDEPENDENT_AMBULATORY_CARE_PROVIDER_SITE_OTHER): Payer: BC Managed Care – PPO | Admitting: Nurse Practitioner

## 2021-05-11 ENCOUNTER — Encounter: Payer: Self-pay | Admitting: Nurse Practitioner

## 2021-05-11 ENCOUNTER — Other Ambulatory Visit: Payer: Self-pay

## 2021-05-11 VITALS — BP 130/80 | HR 78 | Temp 98.2°F | Resp 16 | Ht 68.0 in | Wt >= 6400 oz

## 2021-05-11 DIAGNOSIS — L03116 Cellulitis of left lower limb: Secondary | ICD-10-CM | POA: Diagnosis not present

## 2021-05-11 DIAGNOSIS — E039 Hypothyroidism, unspecified: Secondary | ICD-10-CM

## 2021-05-11 DIAGNOSIS — Z125 Encounter for screening for malignant neoplasm of prostate: Secondary | ICD-10-CM

## 2021-05-11 DIAGNOSIS — Z0001 Encounter for general adult medical examination with abnormal findings: Secondary | ICD-10-CM

## 2021-05-11 DIAGNOSIS — I10 Essential (primary) hypertension: Secondary | ICD-10-CM

## 2021-05-11 DIAGNOSIS — R300 Dysuria: Secondary | ICD-10-CM | POA: Diagnosis not present

## 2021-05-11 DIAGNOSIS — Z6841 Body Mass Index (BMI) 40.0 and over, adult: Secondary | ICD-10-CM

## 2021-05-11 DIAGNOSIS — E782 Mixed hyperlipidemia: Secondary | ICD-10-CM

## 2021-05-11 DIAGNOSIS — E538 Deficiency of other specified B group vitamins: Secondary | ICD-10-CM

## 2021-05-11 DIAGNOSIS — E559 Vitamin D deficiency, unspecified: Secondary | ICD-10-CM

## 2021-05-11 MED ORDER — SEMAGLUTIDE-WEIGHT MANAGEMENT 0.5 MG/0.5ML ~~LOC~~ SOAJ: 0.5000 mg | SUBCUTANEOUS | 2 refills | Status: DC

## 2021-05-11 MED ORDER — LISINOPRIL 10 MG PO TABS: 10.0000 mg | ORAL_TABLET | Freq: Every day | ORAL | 0 refills | Status: DC

## 2021-05-11 MED ORDER — AMOXICILLIN-POT CLAVULANATE 875-125 MG PO TABS: 1.0000 | ORAL_TABLET | Freq: Two times a day (BID) | ORAL | 2 refills | Status: DC

## 2021-05-11 MED ORDER — LEVOTHYROXINE SODIUM 125 MCG PO TABS: 125.0000 ug | ORAL_TABLET | Freq: Every day | ORAL | 1 refills | Status: DC

## 2021-05-11 NOTE — Progress Notes (Unsigned)
[facility] [place], [place] [address]  Internal MEDICINE  Office Visit Note  Patient Name: Isaiah Douglas  [id number]  [id number]  Date of Service: 05/11/2021  Chief Complaint  Patient presents with   Annual Exam   Back Pain    Left side, started 3 to 4 days ago    Back Pain Pertinent negatives include no abdominal pain, chest pain, dysuria, fever, headaches or numbness.  Isaiah Douglas presents for an annual well visit and physical exam. he has a history of ____.  -age appropriate screenings and immunizations as appropriate -COVID vacc status -current problems, concerns.  Medication refills Routine labs Work and home life:  Pain:  130/80    Current Medication: Outpatient Encounter Medications as of 05/11/2021  Medication Sig   aspirin EC 81 MG tablet Take 81 mg by mouth daily.   cyanocobalamin (,VITAMIN B-12,) 1000 MCG/ML injection use as directed once a week for 2 weeks and then once a month   doxycycline (VIBRA-TABS) 100 MG tablet Take 1 tablet (100 mg total) by mouth 2 (two) times daily.   Semaglutide-Weight Management (WEGOVY) 0.25 MG/0.5ML SOAJ Inject 0.25 mg into the skin once a week.   Semaglutide-Weight Management 0.5 MG/0.5ML SOAJ Inject 0.5 mg into the skin once a week.   Syringe/Needle, Disp, (SYRINGE 3CC/20GX1") 20G X 1" 3 ML MISC Use as directed with b12 injection once a week for 2 weeks and then once a week   [DISCONTINUED] amoxicillin-clavulanate (AUGMENTIN) 875-125 MG tablet Take 1 tablet by mouth 2 (two) times daily.   [DISCONTINUED] levothyroxine (SYNTHROID) 125 MCG tablet Take 1 tablet by mouth once daily   [DISCONTINUED] lisinopril (ZESTRIL) 10 MG tablet Take 1 tablet (10 mg total) by mouth daily.   amoxicillin-clavulanate (AUGMENTIN) 875-125 MG tablet Take 1 tablet by mouth 2 (two) times daily.   levothyroxine (SYNTHROID) 125 MCG tablet Take 1 tablet (125 mcg total) by mouth daily.   lisinopril (ZESTRIL) 10 MG tablet Take 1 tablet (10  mg total) by mouth daily.   No facility-administered encounter medications on file as of 05/11/2021.    Surgical History: Past Surgical History:  Procedure Laterality Date   CHOLECYSTECTOMY, LAPAROSCOPIC     GASTRIC BYPASS     HERNIA REPAIR      Medical History: Past Medical History:  Diagnosis Date   Hypothyroidism    Lymphedema    Morbid obesity due to excess calories ([place])     Family History: Family History  Problem Relation Age of Onset   Heart disease Mother    Hyperlipidemia Mother    Diabetes Mother    Hypertension Mother     Social History   Socioeconomic History   Marital status: Married    Spouse name: Not on file   Number of children: Not on file   Years of education: Not on file   Highest education level: Not on file  Occupational History   Not on file  Tobacco Use   Smoking status: Never   Smokeless tobacco: Never  Substance and Sexual Activity   Alcohol use: Yes    Comment: occational   Drug use: Never   Sexual activity: Not on file  Other Topics Concern   Not on file  Social History Narrative   Not on file   Social Determinants of Health   Financial Resource Strain: Not on file  Food Insecurity: Not on file  Transportation Needs: Not on file  Physical Activity: Not on file  Stress: Not on  file  Social Connections: Not on file  Intimate Partner Violence: Not on file      Review of Systems  Constitutional:  Negative for activity change, appetite change, chills, fatigue, fever and unexpected weight change.  HENT: Negative.  Negative for congestion, ear pain, rhinorrhea, sore throat and trouble swallowing.   Eyes: Negative.   Respiratory: Negative.  Negative for cough, chest tightness, shortness of breath and wheezing.   Cardiovascular: Negative.  Negative for chest pain.  Gastrointestinal: Negative.  Negative for abdominal pain, blood in stool, constipation, diarrhea, nausea and vomiting.  Endocrine: Negative.   Genitourinary:  Negative.  Negative for difficulty urinating, dysuria, frequency, hematuria and urgency.  Musculoskeletal:  Positive for back pain. Negative for arthralgias, joint swelling, myalgias and neck pain.  Skin: Negative.  Negative for rash and wound.  Allergic/Immunologic: Negative.  Negative for immunocompromised state.  Neurological: Negative.  Negative for dizziness, seizures, numbness and headaches.  Hematological: Negative.   Psychiatric/Behavioral: Negative.  Negative for behavioral problems, self-injury and suicidal ideas. The patient is not nervous/anxious.    Vital Signs: BP (!) 150/71    Pulse 78    Temp 98.2 F (36.8 C)    Resp 16    Ht 5' 8"  (1.727 m)    Wt (!) 409 lb (185.5 kg)    SpO2 98%    BMI 62.19 kg/m    Physical Exam Vitals reviewed.  Constitutional:      General: He is not in acute distress.    Appearance: He is well-developed. He is not diaphoretic.  HENT:     Head: Normocephalic and atraumatic.     Right Ear: External ear normal.     Left Ear: External ear normal.     Nose: Nose normal.     Mouth/Throat:     Pharynx: No oropharyngeal exudate.  Eyes:     General: No scleral icterus.       Right eye: No discharge.        Left eye: No discharge.     Conjunctiva/sclera: Conjunctivae normal.     Pupils: Pupils are equal, round, and reactive to light.  Neck:     Thyroid: No thyromegaly.     Vascular: No JVD.     Trachea: No tracheal deviation.  Cardiovascular:     Rate and Rhythm: Normal rate and regular rhythm.     Heart sounds: Normal heart sounds. No murmur heard.   No friction rub. No gallop.  Pulmonary:     Effort: Pulmonary effort is normal. No respiratory distress.     Breath sounds: Normal breath sounds. No stridor. No wheezing or rales.  Chest:     Chest wall: No tenderness.  Abdominal:     General: Bowel sounds are normal. There is no distension.     Palpations: Abdomen is soft. There is no mass.     Tenderness: There is no abdominal tenderness.  There is no guarding or rebound.  Musculoskeletal:        General: No tenderness or deformity. Normal range of motion.     Cervical back: Normal range of motion and neck supple.  Lymphadenopathy:     Cervical: No cervical adenopathy.  Skin:    General: Skin is warm and dry.     Coloration: Skin is not pale.     Findings: No erythema or rash.  Neurological:     Mental Status: He is alert.     Cranial Nerves: No cranial nerve deficit.  Motor: No abnormal muscle tone.     Coordination: Coordination normal.     Deep Tendon Reflexes: Reflexes are normal and symmetric.  Psychiatric:        Behavior: Behavior normal.        Thought Content: Thought content normal.        Judgment: Judgment normal.       Assessment/Plan: 1. Dysuria - UA/M w/rflx Culture, Routine - CMP14+EGFR - CBC with Differential/Platelet - Semaglutide-Weight Management 0.5 MG/0.5ML SOAJ; Inject 0.5 mg into the skin once a week.  Dispense: 2 mL; Refill: 2  2. Morbid obesity with body mass index of 60.0-69.9 in adult [facility]) - CMP14+EGFR - CBC with Differential/Platelet - Semaglutide-Weight Management 0.5 MG/0.5ML SOAJ; Inject 0.5 mg into the skin once a week.  Dispense: 2 mL; Refill: 2  3. Left leg cellulitis - CMP14+EGFR - CBC with Differential/Platelet - Semaglutide-Weight Management 0.5 MG/0.5ML SOAJ; Inject 0.5 mg into the skin once a week.  Dispense: 2 mL; Refill: 2 - amoxicillin-clavulanate (AUGMENTIN) 875-125 MG tablet; Take 1 tablet by mouth 2 (two) times daily.  Dispense: 28 tablet; Refill: 2  4. Acquired hypothyroidism - CMP14+EGFR - CBC with Differential/Platelet - TSH + free T4 - levothyroxine (SYNTHROID) 125 MCG tablet; Take 1 tablet (125 mcg total) by mouth daily.  Dispense: 90 tablet; Refill: 1 - Semaglutide-Weight Management 0.5 MG/0.5ML SOAJ; Inject 0.5 mg into the skin once a week.  Dispense: 2 mL; Refill: 2  5. Mixed hyperlipidemia - CMP14+EGFR - Lipid Profile - CBC with  Differential/Platelet - Semaglutide-Weight Management 0.5 MG/0.5ML SOAJ; Inject 0.5 mg into the skin once a week.  Dispense: 2 mL; Refill: 2  6. Essential hypertension - CMP14+EGFR - CBC with Differential/Platelet - Semaglutide-Weight Management 0.5 MG/0.5ML SOAJ; Inject 0.5 mg into the skin once a week.  Dispense: 2 mL; Refill: 2 - lisinopril (ZESTRIL) 10 MG tablet; Take 1 tablet (10 mg total) by mouth daily.  Dispense: 90 tablet; Refill: 0  7. B12 deficiency - CMP14+EGFR - B12 and Folate Panel - CBC with Differential/Platelet - Semaglutide-Weight Management 0.5 MG/0.5ML SOAJ; Inject 0.5 mg into the skin once a week.  Dispense: 2 mL; Refill: 2  8. Vitamin D deficiency - CMP14+EGFR - Vitamin D (25 hydroxy) - CBC with Differential/Platelet - Semaglutide-Weight Management 0.5 MG/0.5ML SOAJ; Inject 0.5 mg into the skin once a week.  Dispense: 2 mL; Refill: 2  9. Encounter for routine adult health examination with abnormal findings - CMP14+EGFR - CBC with Differential/Platelet - Semaglutide-Weight Management 0.5 MG/0.5ML SOAJ; Inject 0.5 mg into the skin once a week.  Dispense: 2 mL; Refill: 2  10. Encounter for screening for malignant neoplasm of prostate - CMP14+EGFR - Vitamin D (25 hydroxy) - B12 and Folate Panel - Lipid Profile - CBC with Differential/Platelet - TSH + free T4 - PSA Total (Reflex To Free) - levothyroxine (SYNTHROID) 125 MCG tablet; Take 1 tablet (125 mcg total) by mouth daily.  Dispense: 90 tablet; Refill: 1 - Semaglutide-Weight Management 0.5 MG/0.5ML SOAJ; Inject 0.5 mg into the skin once a week.  Dispense: 2 mL; Refill: 2 - amoxicillin-clavulanate (AUGMENTIN) 875-125 MG tablet; Take 1 tablet by mouth 2 (two) times daily.  Dispense: 28 tablet; Refill: 2 - lisinopril (ZESTRIL) 10 MG tablet; Take 1 tablet (10 mg total) by mouth daily.  Dispense: 90 tablet; Refill: 0     General Counseling: Xue verbalizes understanding of the findings of todays visit and  agrees with plan of treatment. I have discussed any further diagnostic evaluation  that may be needed or ordered today. We also reviewed his medications today. he has been encouraged to call the office with any questions or concerns that should arise related to todays visit.    Orders Placed This Encounter  Procedures   UA/M w/rflx Culture, Routine   CMP14+EGFR   Vitamin D (25 hydroxy)   B12 and Folate Panel   Lipid Profile   CBC with Differential/Platelet   TSH + free T4   PSA Total (Reflex To Free)    Meds ordered this encounter  Medications   levothyroxine (SYNTHROID) 125 MCG tablet    Sig: Take 1 tablet (125 mcg total) by mouth daily.    Dispense:  90 tablet    Refill:  1   Semaglutide-Weight Management 0.5 MG/0.5ML SOAJ    Sig: Inject 0.5 mg into the skin once a week.    Dispense:  2 mL    Refill:  2   amoxicillin-clavulanate (AUGMENTIN) 875-125 MG tablet    Sig: Take 1 tablet by mouth 2 (two) times daily.    Dispense:  28 tablet    Refill:  2   lisinopril (ZESTRIL) 10 MG tablet    Sig: Take 1 tablet (10 mg total) by mouth daily.    Dispense:  90 tablet    Refill:  0    Return in about 1 month (around 06/08/2021) for F/U, Weight loss, Ihan Pat PCP.   Total time spent:*** Minutes Time spent includes review of chart, medications, test results, and follow up plan with the patient.   [place] Controlled Substance Database was reviewed by me.  This patient was seen by Jonetta Osgood, FNP-C in collaboration with Dr. Clayborn Bigness as a part of collaborative care agreement.  Dayle Mcnerney R. Valetta Fuller, MSN, FNP-C Internal medicine

## 2021-05-12 LAB — UA/M W/RFLX CULTURE, ROUTINE
Bilirubin, UA: NEGATIVE
Glucose, UA: NEGATIVE
Ketones, UA: NEGATIVE
Leukocytes,UA: NEGATIVE
Nitrite, UA: NEGATIVE
Protein,UA: NEGATIVE
RBC, UA: NEGATIVE
Specific Gravity, UA: 1.016 (ref 1.005–1.030)
Urobilinogen, Ur: 0.2 mg/dL (ref 0.2–1.0)
pH, UA: 5.5 (ref 5.0–7.5)

## 2021-05-12 LAB — MICROSCOPIC EXAMINATION
Bacteria, UA: NONE SEEN
Casts: NONE SEEN /lpf
Epithelial Cells (non renal): NONE SEEN /hpf (ref 0–10)
RBC, Urine: NONE SEEN /hpf (ref 0–2)

## 2021-05-17 DIAGNOSIS — E559 Vitamin D deficiency, unspecified: Secondary | ICD-10-CM | POA: Diagnosis not present

## 2021-05-17 DIAGNOSIS — E039 Hypothyroidism, unspecified: Secondary | ICD-10-CM | POA: Diagnosis not present

## 2021-05-17 DIAGNOSIS — Z125 Encounter for screening for malignant neoplasm of prostate: Secondary | ICD-10-CM | POA: Diagnosis not present

## 2021-05-17 DIAGNOSIS — E782 Mixed hyperlipidemia: Secondary | ICD-10-CM | POA: Diagnosis not present

## 2021-05-17 DIAGNOSIS — Z6841 Body Mass Index (BMI) 40.0 and over, adult: Secondary | ICD-10-CM | POA: Diagnosis not present

## 2021-05-17 DIAGNOSIS — E538 Deficiency of other specified B group vitamins: Secondary | ICD-10-CM | POA: Diagnosis not present

## 2021-05-17 DIAGNOSIS — I10 Essential (primary) hypertension: Secondary | ICD-10-CM | POA: Diagnosis not present

## 2021-05-17 DIAGNOSIS — L03116 Cellulitis of left lower limb: Secondary | ICD-10-CM | POA: Diagnosis not present

## 2021-05-17 DIAGNOSIS — R300 Dysuria: Secondary | ICD-10-CM | POA: Diagnosis not present

## 2021-05-17 LAB — LIPID PANEL

## 2021-05-18 LAB — B12 AND FOLATE PANEL
Folate: 8.3 ng/mL (ref >3.0)
Vitamin B-12: 150 pg/mL — ABNORMAL LOW (ref 232–1245)

## 2021-05-18 LAB — CBC WITH DIFFERENTIAL/PLATELET
Basophils Absolute: 0 10*3/uL (ref 0.0–0.2)
Basos: 1 %
EOS (ABSOLUTE): 0.3 10*3/uL (ref 0.0–0.4)
Eos: 4 %
Hematocrit: 41 % (ref 37.5–51.0)
Hemoglobin: 14.1 g/dL (ref 13.0–17.7)
Immature Grans (Abs): 0 10*3/uL (ref 0.0–0.1)
Immature Granulocytes: 0 %
Lymphocytes Absolute: 1.8 10*3/uL (ref 0.7–3.1)
Lymphs: 31 %
MCH: 30.6 pg (ref 26.6–33.0)
MCHC: 34.4 g/dL (ref 31.5–35.7)
MCV: 89 fL (ref 79–97)
Monocytes Absolute: 0.5 10*3/uL (ref 0.1–0.9)
Monocytes: 8 %
Neutrophils Absolute: 3.3 10*3/uL (ref 1.4–7.0)
Neutrophils: 56 %
Platelets: 230 10*3/uL (ref 150–450)
RBC: 4.61 x10E6/uL (ref 4.14–5.80)
RDW: 12.8 % (ref 11.6–15.4)
WBC: 5.9 10*3/uL (ref 3.4–10.8)

## 2021-05-18 LAB — CMP14+EGFR
ALT: 17 IU/L (ref 0–44)
AST: 19 IU/L (ref 0–40)
Albumin/Globulin Ratio: 1.7 (ref 1.2–2.2)
Albumin: 4 g/dL (ref 3.8–4.9)
Alkaline Phosphatase: 58 IU/L (ref 44–121)
BUN/Creatinine Ratio: 14 (ref 9–20)
BUN: 13 mg/dL (ref 6–24)
Bilirubin Total: 0.4 mg/dL (ref 0.0–1.2)
CO2: 20 mmol/L (ref 20–29)
Calcium: 8.4 mg/dL — ABNORMAL LOW (ref 8.7–10.2)
Chloride: 110 mmol/L — ABNORMAL HIGH (ref 96–106)
Creatinine, Ser: 0.93 mg/dL (ref 0.76–1.27)
Globulin, Total: 2.3 g/dL (ref 1.5–4.5)
Glucose: 92 mg/dL (ref 70–99)
Potassium: 4.4 mmol/L (ref 3.5–5.2)
Sodium: 146 mmol/L — ABNORMAL HIGH (ref 134–144)
Total Protein: 6.3 g/dL (ref 6.0–8.5)
eGFR: 96 mL/min/{1.73_m2} (ref >59)

## 2021-05-18 LAB — PSA TOTAL (REFLEX TO FREE): Prostate Specific Ag, Serum: 0.3 ng/mL (ref 0.0–4.0)

## 2021-05-18 LAB — LIPID PANEL
Chol/HDL Ratio: 3.5 ratio (ref 0.0–5.0)
Cholesterol, Total: 183 mg/dL (ref 100–199)
HDL: 52 mg/dL (ref >39)
LDL Chol Calc (NIH): 106 mg/dL — ABNORMAL HIGH (ref 0–99)
Triglycerides: 143 mg/dL (ref 0–149)
VLDL Cholesterol Cal: 25 mg/dL (ref 5–40)

## 2021-05-18 LAB — TSH+FREE T4
Free T4: 1.14 ng/dL (ref 0.82–1.77)
TSH: 5.94 u[IU]/mL — ABNORMAL HIGH (ref 0.450–4.500)

## 2021-05-18 LAB — VITAMIN D 25 HYDROXY (VIT D DEFICIENCY, FRACTURES): Vit D, 25-Hydroxy: 24.4 ng/mL — ABNORMAL LOW (ref 30.0–100.0)

## 2021-05-21 ENCOUNTER — Telehealth: Payer: Self-pay

## 2021-05-21 NOTE — Progress Notes (Signed)
I have reviewed the lab results. There are no critically abnormal values requiring immediate intervention but there are some abnormals that will be discussed at the next office visit.  

## 2021-05-21 NOTE — Telephone Encounter (Signed)
Per Cristie Hem she done PA for [facility] 0.5 mg/0.7ml 05/21/21.  It came back denied.

## 2021-05-25 ENCOUNTER — Telehealth: Payer: Self-pay

## 2021-05-26 ENCOUNTER — Telehealth: Payer: Self-pay

## 2021-05-26 ENCOUNTER — Other Ambulatory Visit: Payer: Self-pay

## 2021-05-26 MED ORDER — OZEMPIC (0.25 OR 0.5 MG/DOSE) 2 MG/3ML ~~LOC~~ SOPN: 0.5000 mL | PEN_INJECTOR | SUBCUTANEOUS | 3 refills | Status: DC

## 2021-05-26 NOTE — Telephone Encounter (Signed)
Changed wegovy to ozempic ?

## 2021-05-26 NOTE — Telephone Encounter (Signed)
PA for wegovy was denied and per [name] she changed to [facility].  I sent rx into pharmacy and informed pt of the change and advised that if insurance doesn't cover and requires a PA to have his pharmacy send [place] the notification ?

## 2021-05-27 ENCOUNTER — Telehealth: Payer: Self-pay

## 2021-05-27 NOTE — Telephone Encounter (Signed)
PA sent for [facility] 0.5mg /dose 05/27/21 at 448pm ?

## 2021-06-08 ENCOUNTER — Encounter: Payer: Self-pay | Admitting: Nurse Practitioner

## 2021-06-08 ENCOUNTER — Ambulatory Visit: Payer: BC Managed Care – PPO | Admitting: Nurse Practitioner

## 2021-06-08 ENCOUNTER — Other Ambulatory Visit: Payer: Self-pay

## 2021-06-08 VITALS — BP 140/80 | HR 60 | Temp 98.3°F | Resp 16 | Ht 68.0 in | Wt >= 6400 oz

## 2021-06-08 DIAGNOSIS — I10 Essential (primary) hypertension: Secondary | ICD-10-CM

## 2021-06-08 DIAGNOSIS — E039 Hypothyroidism, unspecified: Secondary | ICD-10-CM

## 2021-06-08 DIAGNOSIS — Z6841 Body Mass Index (BMI) 40.0 and over, adult: Secondary | ICD-10-CM

## 2021-06-08 DIAGNOSIS — M545 Low back pain, unspecified: Secondary | ICD-10-CM | POA: Diagnosis not present

## 2021-06-08 DIAGNOSIS — E782 Mixed hyperlipidemia: Secondary | ICD-10-CM | POA: Diagnosis not present

## 2021-06-08 LAB — POCT GLYCOSYLATED HEMOGLOBIN (HGB A1C): Hemoglobin A1C: 5.1 % (ref 4.0–5.6)

## 2021-06-08 MED ORDER — TIZANIDINE HCL 2 MG PO TABS: 2.0000 mg | ORAL_TABLET | Freq: Every evening | ORAL | 1 refills | Status: DC | PRN

## 2021-06-08 NOTE — Progress Notes (Signed)
[facility] ?[address] ?[place], [place] [id number] ? ?Internal MEDICINE  ?Office Visit Note ? ?Patient Name: Isaiah RouteDanny R Douglas ? [id number]  ?[id number] ? ?Date of Service: 06/08/2021 ? ?Chief Complaint  ?Patient presents with  ? Follow-up  ?  Lower back pain, started about a month ago  ? Weight Loss  ? ? ?HPI ?Dannielle HuhDanny presents for follow-up visit for weight loss management today.  Approximately a month ago he was provided a sample of Wegovy for 4 weeks and he is lost 5 pounds since his last office visit.  [facility] and [facility] insurance denied the prior authorization.  [facility] and his insurance denied the prescription again and now requiring a prior authorization and they denied the prior authorization as well.  We were able to find out that his insurance does not cover Wegovy at all.   ?The patient has been experiencing some lower back pain that started about a month ago.  It bothers him the most when he is laying down at night in bed and it just goes across his lower back gets achy and sore and stiff. It was bothering him at his previous office visit but he did not mention it because he thought it would just resolve on its own but it did not. ? ? ? ?Current Medication: ?Outpatient Encounter Medications as of 06/08/2021  ?Medication Sig  ? amoxicillin-clavulanate (AUGMENTIN) 875-125 MG tablet Take 1 tablet by mouth 2 (two) times daily.  ? aspirin EC 81 MG tablet Take 81 mg by mouth daily.  ? cyanocobalamin (,VITAMIN B-12,) 1000 MCG/ML injection use as directed once a week for 2 weeks and then once a month  ? doxycycline (VIBRA-TABS) 100 MG tablet Take 1 tablet (100 mg total) by mouth 2 (two) times daily.  ? levothyroxine (SYNTHROID) 125 MCG tablet Take 1 tablet (125 mcg total) by mouth daily.  ? lisinopril (ZESTRIL) 10 MG tablet Take 1 tablet (10 mg total) by mouth daily.  ? Semaglutide,0.25 or 0.5MG /DOS, (OZEMPIC, 0.25 OR 0.5 MG/DOSE,) 2  MG/3ML SOPN Inject 0.5 mLs into the skin once a week.  ? Semaglutide-Weight Management 0.5 MG/0.5ML SOAJ Inject 0.5 mg into the skin once a week.  ? Syringe/Needle, Disp, (SYRINGE 3CC/20GX1") 20G X 1" 3 ML MISC Use as directed with b12 injection once a week for 2 weeks and then once a week  ? tiZANidine (ZANAFLEX) 2 MG tablet Take 1 tablet (2 mg total) by mouth at bedtime as needed for muscle spasms.  ? ?No facility-administered encounter medications on file as of 06/08/2021.  ? ? ?Surgical History: ?Past Surgical History:  ?Procedure Laterality Date  ? CHOLECYSTECTOMY, LAPAROSCOPIC    ? GASTRIC BYPASS    ? HERNIA REPAIR    ? ? ?Medical History: ?Past Medical History:  ?Diagnosis Date  ? Hypothyroidism   ? Lymphedema   ? Morbid obesity due to excess calories ([facility])   ? ? ?Family History: ?Family History  ?Problem Relation Age of Onset  ? Heart disease Mother   ? Hyperlipidemia Mother   ? Diabetes Mother   ? Hypertension Mother   ? ? ?Social History  ? ?Socioeconomic History  ? Marital status: Married  ?  Spouse name: Not on file  ? Number of children: Not on file  ? Years of education: Not on file  ? Highest education level: Not on file  ?Occupational History  ? Not on file  ?Tobacco Use  ?  Smoking status: Never  ? Smokeless tobacco: Never  ?Substance and Sexual Activity  ? Alcohol use: Yes  ?  Comment: occational  ? Drug use: Never  ? Sexual activity: Not on file  ?Other Topics Concern  ? Not on file  ?Social History Narrative  ? Not on file  ? ?Social Determinants of Health  ? ?Financial Resource Strain: Not on file  ?Food Insecurity: Not on file  ?Transportation Needs: Not on file  ?Physical Activity: Not on file  ?Stress: Not on file  ?Social Connections: Not on file  ?Intimate Partner Violence: Not on file  ? ? ? ? ?Review of Systems  ?Constitutional:  Positive for fatigue. Negative for appetite change and chills. Unexpected weight change: keeps gaining weight. ?HENT:  Negative for congestion, rhinorrhea,  sneezing and sore throat.   ?Eyes:  Negative for redness.  ?Respiratory:  Negative for cough, chest tightness and shortness of breath.   ?Cardiovascular:  Negative for chest pain and palpitations.  ?Gastrointestinal:  Negative for abdominal pain, constipation, diarrhea, nausea and vomiting.  ?Genitourinary:  Negative for dysuria and frequency.  ?Musculoskeletal:  Negative for arthralgias, back pain, joint swelling and neck pain.  ?Skin:  Negative for rash.  ?Neurological: Negative.  Negative for tremors and numbness.  ?Hematological:  Negative for adenopathy. Does not bruise/bleed easily.  ?Psychiatric/Behavioral:  Negative for behavioral problems (Depression), sleep disturbance and suicidal ideas. The patient is not nervous/anxious.   ? ?Vital Signs: ?BP 140/80 Comment: 174/76  Pulse 60 Comment: 53  Temp 98.3 ?F (36.8 ?C)   Resp 16   Ht 5\' 8"  (1.727 m)   Wt (!) 404 lb 12.8 oz (183.6 kg)   SpO2 99%   BMI 61.55 kg/m?  ? ? ?Physical Exam ?Vitals reviewed.  ?Constitutional:   ?   General: He is not in acute distress. ?   Appearance: Normal appearance. He is well-developed. He is morbidly obese. He is not ill-appearing or toxic-appearing.  ?HENT:  ?   Head: Normocephalic and atraumatic.  ?Eyes:  ?   Pupils: Pupils are equal, round, and reactive to light.  ?Cardiovascular:  ?   Rate and Rhythm: Normal rate and regular rhythm.  ?   Pulses: Normal pulses.  ?   Heart sounds: Normal heart sounds.  ?Pulmonary:  ?   Effort: Pulmonary effort is normal.  ?   Breath sounds: Normal breath sounds.  ?Musculoskeletal:  ?   Cervical back: Normal range of motion and neck supple.  ?Skin: ?   General: Skin is warm and dry.  ?   Capillary Refill: Capillary refill takes less than 2 seconds.  ?Neurological:  ?   Mental Status: He is alert and oriented to person, place, and time.  ?Psychiatric:     ?   Mood and Affect: Mood normal.     ?   Behavior: Behavior normal. Behavior is cooperative.  ? ? ? ? ? ?Assessment/Plan: ?1. Acute  bilateral low back pain without sciatica ?Patient prescribed tizanidine to help with musculoskeletal pain and spasms at night.  Will reevaluate in a month ?- tiZANidine (ZANAFLEX) 2 MG tablet; Take 1 tablet (2 mg total) by mouth at bedtime as needed for muscle spasms.  Dispense: 30 tablet; Refill: 1 ? ?2. Essential hypertension ?Blood pressure is a little bit elevated and is a risk factor along with obesity for developing diabetes.  A1c checked in office today to rule out diabetes.  Patient's A1c was 5.1 which is normal.  His blood pressures been  elevated the past couple of office visits, this could be because he is experiencing back pain right now.  If his blood pressure is elevated at his next office visit in 1 month, will discuss adjusting his blood pressure medication. ?- POCT glycosylated hemoglobin (Hb A1C) ? ?3. Acquired hypothyroidism ?Patient is on levothyroxine to control his hypothyroidism and hypothyroidism can cause weight gain.  ?- POCT glycosylated hemoglobin (Hb A1C) ? ?4. Mixed hyperlipidemia ?Hyperlipidemia can increase the risk of developing diabetes, A1c was normal today. ?- POCT glycosylated hemoglobin (Hb A1C) ? ?5. Morbid obesity with body mass index of 60.0-69.9 in adult [facility]) ?The [facility] prescription was denied by his insurance and we found out that they do not cover Wegovy at all.  Ozempic prescription was denied by his insurance.  The plan will be to file an appeal.  4 more weeks of samples of Wegovy provided to patient since we are unable to get any of the injectable medication approved yet.  Discussed diet and physical activity and patient is making moves to be healthier and to help him lose weight.  We will reevaluate in 1 month ?- POCT glycosylated hemoglobin (Hb A1C) ? ? ?General Counseling: Corrion verbalizes understanding of the findings of todays visit and agrees with plan of treatment. I have discussed any further diagnostic evaluation that may be needed or ordered today. We also  reviewed his medications today. he has been encouraged to call the office with any questions or concerns that should arise related to todays visit. ? ? ? ?Orders Placed This Encounter  ?Procedures  ? POCT glycosylated hemog

## 2021-06-09 ENCOUNTER — Encounter: Payer: Self-pay | Admitting: Nurse Practitioner

## 2021-07-07 ENCOUNTER — Ambulatory Visit: Payer: BC Managed Care – PPO | Admitting: Nurse Practitioner

## 2021-08-15 DIAGNOSIS — R001 Bradycardia, unspecified: Secondary | ICD-10-CM | POA: Diagnosis not present

## 2021-08-15 DIAGNOSIS — Z9889 Other specified postprocedural states: Secondary | ICD-10-CM | POA: Diagnosis not present

## 2021-08-15 DIAGNOSIS — Z79890 Hormone replacement therapy (postmenopausal): Secondary | ICD-10-CM | POA: Diagnosis not present

## 2021-08-15 DIAGNOSIS — M1990 Unspecified osteoarthritis, unspecified site: Secondary | ICD-10-CM | POA: Diagnosis not present

## 2021-08-15 DIAGNOSIS — E7800 Pure hypercholesterolemia, unspecified: Secondary | ICD-10-CM | POA: Diagnosis not present

## 2021-08-15 DIAGNOSIS — R109 Unspecified abdominal pain: Secondary | ICD-10-CM | POA: Diagnosis not present

## 2021-08-15 DIAGNOSIS — R142 Eructation: Secondary | ICD-10-CM | POA: Diagnosis not present

## 2021-08-15 DIAGNOSIS — I10 Essential (primary) hypertension: Secondary | ICD-10-CM | POA: Diagnosis not present

## 2021-08-15 DIAGNOSIS — R1033 Periumbilical pain: Secondary | ICD-10-CM | POA: Diagnosis not present

## 2021-08-15 DIAGNOSIS — E079 Disorder of thyroid, unspecified: Secondary | ICD-10-CM | POA: Diagnosis not present

## 2021-08-15 DIAGNOSIS — K436 Other and unspecified ventral hernia with obstruction, without gangrene: Secondary | ICD-10-CM | POA: Diagnosis not present

## 2021-08-15 DIAGNOSIS — Z7982 Long term (current) use of aspirin: Secondary | ICD-10-CM | POA: Diagnosis not present

## 2021-08-16 ENCOUNTER — Telehealth: Payer: Self-pay

## 2021-08-16 NOTE — Telephone Encounter (Signed)
Attempted to contact patient to schedule ED follow up. No answer, vm not set up, no mychart-Isaiah Douglas

## 2021-09-08 ENCOUNTER — Ambulatory Visit: Payer: BC Managed Care – PPO | Admitting: Nurse Practitioner

## 2021-09-22 DIAGNOSIS — Z6841 Body Mass Index (BMI) 40.0 and over, adult: Secondary | ICD-10-CM | POA: Diagnosis not present

## 2021-09-22 DIAGNOSIS — K432 Incisional hernia without obstruction or gangrene: Secondary | ICD-10-CM | POA: Diagnosis not present

## 2021-09-23 ENCOUNTER — Other Ambulatory Visit: Payer: Self-pay | Admitting: Nurse Practitioner

## 2021-09-23 DIAGNOSIS — Z125 Encounter for screening for malignant neoplasm of prostate: Secondary | ICD-10-CM

## 2021-09-23 DIAGNOSIS — I10 Essential (primary) hypertension: Secondary | ICD-10-CM

## 2021-09-29 ENCOUNTER — Ambulatory Visit: Payer: BC Managed Care – PPO | Admitting: Nurse Practitioner

## 2021-09-29 ENCOUNTER — Encounter: Payer: Self-pay | Admitting: Nurse Practitioner

## 2021-09-29 VITALS — BP 140/80 | HR 60 | Temp 98.9°F | Resp 16 | Ht 66.0 in | Wt >= 6400 oz

## 2021-09-29 DIAGNOSIS — Z125 Encounter for screening for malignant neoplasm of prostate: Secondary | ICD-10-CM

## 2021-09-29 DIAGNOSIS — K439 Ventral hernia without obstruction or gangrene: Secondary | ICD-10-CM

## 2021-09-29 DIAGNOSIS — Z6841 Body Mass Index (BMI) 40.0 and over, adult: Secondary | ICD-10-CM | POA: Diagnosis not present

## 2021-09-29 DIAGNOSIS — E039 Hypothyroidism, unspecified: Secondary | ICD-10-CM | POA: Diagnosis not present

## 2021-09-29 MED ORDER — DIETHYLPROPION HCL ER 75 MG PO TB24: 75.0000 mg | ORAL_TABLET | Freq: Every day | ORAL | 0 refills | Status: DC

## 2021-09-29 NOTE — Progress Notes (Signed)
[facility] [address] [place], [place] [address]  Internal MEDICINE  Office Visit Note  Patient Name: Isaiah Douglas  [id number]  [id number]  Date of Service: 09/29/2021  Chief Complaint  Patient presents with   Follow-up    pt seen in ED for abdominal hernia with obstruction, without gangrene. Isaiah Douglas is following up with specialist at [facility]    When walking     HPI Isaiah Douglas presents for a follow up visit for recent ED visit regarding abdominal hernia.  --seen in ED for abdominal hernia, not incarcerated, needs surgery, specialist said Isaiah Douglas needs to lose weight to have surgery. Sees specialist at [facility] --BP is stable.  --has SOB when walking, thinks could be due to his weight.  --have been trying to find a medication that is affordable that can aid in weight loss management. His insurance will not cover medications for obesity or weight loss management and Isaiah Douglas does not have diabetes or impaired fasting glucose. We have tried prescribing wegovy, ozempic, and others but insurance will not approve them despite the fact Isaiah Douglas qualifies for the criteria to take them.     Current Medication: Outpatient Encounter Medications as of 09/29/2021  Medication Sig   amoxicillin-clavulanate (AUGMENTIN) 875-125 MG tablet Take 1 tablet by mouth 2 (two) times daily.   aspirin EC 81 MG tablet Take 81 mg by mouth daily.   cyanocobalamin (,VITAMIN B-12,) 1000 MCG/ML injection use as directed once a week for 2 weeks and then once a month   Diethylpropion HCl CR 75 MG TB24 Take 1 tablet (75 mg total) by mouth daily before breakfast.   doxycycline (VIBRA-TABS) 100 MG tablet Take 1 tablet (100 mg total) by mouth 2 (two) times daily.   levothyroxine (SYNTHROID) 125 MCG tablet Take 1 tablet (125 mcg total) by mouth daily.   lisinopril (ZESTRIL) 10 MG tablet Take 1 tablet by mouth once daily   Semaglutide,0.25 or 0.5MG /DOS, (OZEMPIC, 0.25 OR 0.5 MG/DOSE,) 2 MG/3ML SOPN Inject 0.5 mLs into the  skin once a week.   Semaglutide-Weight Management 0.5 MG/0.5ML SOAJ Inject 0.5 mg into the skin once a week.   Syringe/Needle, Disp, (SYRINGE 3CC/20GX1") 20G X 1" 3 ML MISC Use as directed with b12 injection once a week for 2 weeks and then once a week   tiZANidine (ZANAFLEX) 2 MG tablet Take 1 tablet (2 mg total) by mouth at bedtime as needed for muscle spasms.   No facility-administered encounter medications on file as of 09/29/2021.    Surgical History: Past Surgical History:  Procedure Laterality Date   CHOLECYSTECTOMY, LAPAROSCOPIC     GASTRIC BYPASS     HERNIA REPAIR      Medical History: Past Medical History:  Diagnosis Date   Hypothyroidism    Lymphedema    Morbid obesity due to excess calories (HCC)     Family History: Family History  Problem Relation Age of Onset   Heart disease Mother    Hyperlipidemia Mother    Diabetes Mother    Hypertension Mother     Social History   Socioeconomic History   Marital status: Married    Spouse name: Not on file   Number of children: Not on file   Years of education: Not on file   Highest education level: Not on file  Occupational History   Not on file  Tobacco Use   Smoking status: Never   Smokeless tobacco: Never  Substance and Sexual Activity  Alcohol use: Yes    Comment: occational   Drug use: Never   Sexual activity: Not on file  Other Topics Concern   Not on file  Social History Narrative   Not on file   Social Determinants of Health   Financial Resource Strain: Not on file  Food Insecurity: Not on file  Transportation Needs: Not on file  Physical Activity: Not on file  Stress: Not on file  Social Connections: Not on file  Intimate Partner Violence: Not on file      Review of Systems  Constitutional:  Positive for fatigue. Negative for appetite change and chills. Unexpected weight change: keeps gaining weight. HENT:  Negative for congestion, rhinorrhea, sneezing and sore throat.   Eyes:  Negative  for redness.  Respiratory:  Negative for cough, chest tightness and shortness of breath.   Cardiovascular:  Negative for chest pain and palpitations.  Gastrointestinal:  Negative for abdominal pain, constipation, diarrhea, nausea and vomiting.  Genitourinary:  Negative for dysuria and frequency.  Musculoskeletal:  Negative for arthralgias, back pain, joint swelling and neck pain.  Skin:  Negative for rash.  Neurological: Negative.  Negative for tremors and numbness.  Hematological:  Negative for adenopathy. Does not bruise/bleed easily.  Psychiatric/Behavioral:  Negative for behavioral problems (Depression), sleep disturbance and suicidal ideas. The patient is not nervous/anxious.     Vital Signs: BP 140/80   Pulse 60   Temp 98.9 F (37.2 C)   Resp 16   Ht 5\' 6"  (1.676 m)   Wt (!) 402 lb 6.4 oz (182.5 kg)   SpO2 97%   BMI 64.95 kg/m    Physical Exam Vitals reviewed.  Constitutional:      General: Isaiah Douglas is not in acute distress.    Appearance: Normal appearance. Isaiah Douglas is obese. Isaiah Douglas is not ill-appearing.  HENT:     Head: Normocephalic and atraumatic.  Eyes:     Pupils: Pupils are equal, round, and reactive to light.  Cardiovascular:     Rate and Rhythm: Normal rate and regular rhythm.     Heart sounds: Normal heart sounds. No murmur heard. Pulmonary:     Effort: Pulmonary effort is normal. No respiratory distress.     Breath sounds: Normal breath sounds. No wheezing.  Neurological:     Mental Status: Isaiah Douglas is alert and oriented to person, place, and time.  Psychiatric:        Mood and Affect: Mood normal.        Behavior: Behavior normal.        Assessment/Plan: 1. Ventral hernia without obstruction or gangrene Was incarcerated in ED and was successfully reduced. Was referred to general surgery through [facility], was informed Isaiah Douglas needs to lose weight before the surgery can be scheduled.   2. Acquired hypothyroidism Stable, no issues  3. Morbid obesity with body mass index of  60.0-69.9 in adult [facility]) Requesting assistance with weight loss. Has done the metabolic test, has made diet modifications and is eating healthier protein-rich and low carb meals. Hx of gastric bypass surgery. Current weight is 402 lbs. Lifestyle modifications were discussed, patient tries to be active as much as physically possible for him. Isaiah Douglas is highly motivated.  -insurance will not cover ozempic, wegovy, rybelsus, contrave, qysmia. Isaiah Douglas has tried phentermine in the past without side effects. Discussed options--patient agreeable to diethylpropion ER. Prescription to pharmacy with the best discount via goodrx coupon which was sent to patient's phone. Most generic appetite suppressants are not covered by insurance but  are very affordable with a discount card/coupon like good RX or others. Follow up in 4 weeks for weight loss management and med refill.      General Counseling: Isaiah Douglas verbalizes understanding of the findings of todays visit and agrees with plan of treatment. I have discussed any further diagnostic evaluation that may be needed or ordered today. We also reviewed his medications today. Isaiah Douglas has been encouraged to call the office with any questions or concerns that should arise related to todays visit.    No orders of the defined types were placed in this encounter.   Meds ordered this encounter  Medications   Diethylpropion HCl CR 75 MG TB24    Sig: Take 1 tablet (75 mg total) by mouth daily before breakfast.    Dispense:  30 tablet    Refill:  0    Do not run through insurance, patient will bring good rx coupon    Return in about 4 weeks (around 10/27/2021) for F/U, Weight loss, Isaiah Douglas PCP.   Total time spent:30 Minutes Time spent includes review of chart, medications, test results, and follow up plan with the patient.   [place] Controlled Substance Database was reviewed by me.  This patient was seen by Sallyanne Kuster, FNP-C in collaboration with Dr. Beverely Risen as a part of  collaborative care agreement.   Zavian Slowey R. Tedd Sias, MSN, FNP-C Internal medicine

## 2021-11-03 ENCOUNTER — Ambulatory Visit: Payer: BC Managed Care – PPO | Admitting: Nurse Practitioner

## 2021-11-10 ENCOUNTER — Ambulatory Visit: Payer: BC Managed Care – PPO | Admitting: Nurse Practitioner

## 2021-11-10 ENCOUNTER — Encounter: Payer: Self-pay | Admitting: Nurse Practitioner

## 2021-11-10 VITALS — BP 140/64 | HR 75 | Temp 98.0°F | Resp 16 | Ht 66.0 in | Wt 397.0 lb

## 2021-11-10 DIAGNOSIS — Z125 Encounter for screening for malignant neoplasm of prostate: Secondary | ICD-10-CM

## 2021-11-10 DIAGNOSIS — I10 Essential (primary) hypertension: Secondary | ICD-10-CM | POA: Diagnosis not present

## 2021-11-10 DIAGNOSIS — E039 Hypothyroidism, unspecified: Secondary | ICD-10-CM | POA: Diagnosis not present

## 2021-11-10 DIAGNOSIS — Z6841 Body Mass Index (BMI) 40.0 and over, adult: Secondary | ICD-10-CM | POA: Diagnosis not present

## 2021-11-10 MED ORDER — LEVOTHYROXINE SODIUM 125 MCG PO TABS: 125.0000 ug | ORAL_TABLET | Freq: Every day | ORAL | 3 refills | Status: DC

## 2021-11-10 MED ORDER — DIETHYLPROPION HCL ER 75 MG PO TB24: 75.0000 mg | ORAL_TABLET | Freq: Every day | ORAL | 0 refills | Status: DC

## 2021-11-10 MED ORDER — LISINOPRIL 10 MG PO TABS: 10.0000 mg | ORAL_TABLET | Freq: Every day | ORAL | 3 refills | Status: DC

## 2021-11-10 NOTE — Progress Notes (Signed)
[facility] [address] [place], [place] [address]  Internal MEDICINE  Office Visit Note  Patient Name: Isaiah Douglas  [id number]  [id number]  Date of Service: 11/10/2021  Chief Complaint  Patient presents with   Follow-up   Medical Management of Chronic Issues    Weight management    Quality Metric Gaps    Colonoscopy     HPI Preston presents for a follow up visit for weight loss management, chronic back pain,  --started diethylpropion, working well, lost 5 lbs.  --stable BP Need refills   Current Medication: Outpatient Encounter Medications as of 11/10/2021  Medication Sig   amoxicillin-clavulanate (AUGMENTIN) 875-125 MG tablet Take 1 tablet by mouth 2 (two) times daily.   aspirin EC 81 MG tablet Take 81 mg by mouth daily.   doxycycline (VIBRA-TABS) 100 MG tablet Take 1 tablet (100 mg total) by mouth 2 (two) times daily.   tiZANidine (ZANAFLEX) 2 MG tablet Take 1 tablet (2 mg total) by mouth at bedtime as needed for muscle spasms.   [DISCONTINUED] cyanocobalamin (,VITAMIN B-12,) 1000 MCG/ML injection use as directed once a week for 2 weeks and then once a month   [DISCONTINUED] Diethylpropion HCl CR 75 MG TB24 Take 1 tablet (75 mg total) by mouth daily before breakfast.   [DISCONTINUED] levothyroxine (SYNTHROID) 125 MCG tablet Take 1 tablet (125 mcg total) by mouth daily.   [DISCONTINUED] lisinopril (ZESTRIL) 10 MG tablet Take 1 tablet by mouth once daily   [DISCONTINUED] Semaglutide,0.25 or 0.5MG /DOS, (OZEMPIC, 0.25 OR 0.5 MG/DOSE,) 2 MG/3ML SOPN Inject 0.5 mLs into the skin once a week.   [DISCONTINUED] Semaglutide-Weight Management 0.5 MG/0.5ML SOAJ Inject 0.5 mg into the skin once a week.   [DISCONTINUED] Syringe/Needle, Disp, (SYRINGE 3CC/20GX1") 20G X 1" 3 ML MISC Use as directed with b12 injection once a week for 2 weeks and then once a week   Diethylpropion HCl CR 75 MG TB24 Take 1 tablet (75 mg total) by mouth daily before breakfast.   levothyroxine  (SYNTHROID) 125 MCG tablet Take 1 tablet (125 mcg total) by mouth daily.   lisinopril (ZESTRIL) 10 MG tablet Take 1 tablet (10 mg total) by mouth daily.   No facility-administered encounter medications on file as of 11/10/2021.    Surgical History: Past Surgical History:  Procedure Laterality Date   CHOLECYSTECTOMY, LAPAROSCOPIC     GASTRIC BYPASS     HERNIA REPAIR      Medical History: Past Medical History:  Diagnosis Date   Hypothyroidism    Lymphedema    Morbid obesity due to excess calories (HCC)     Family History: Family History  Problem Relation Age of Onset   Heart disease Mother    Hyperlipidemia Mother    Diabetes Mother    Hypertension Mother     Social History   Socioeconomic History   Marital status: Married    Spouse name: Not on file   Number of children: Not on file   Years of education: Not on file   Highest education level: Not on file  Occupational History   Not on file  Tobacco Use   Smoking status: Never   Smokeless tobacco: Never  Substance and Sexual Activity   Alcohol use: Yes    Comment: occational   Drug use: Never   Sexual activity: Not on file  Other Topics Concern   Not on file  Social History Narrative   Not on file   Social Determinants of Health  Financial Resource Strain: Not on file  Food Insecurity: Not on file  Transportation Needs: Not on file  Physical Activity: Not on file  Stress: Not on file  Social Connections: Not on file  Intimate Partner Violence: Not on file      Review of Systems  Constitutional:  Negative for chills, fatigue and unexpected weight change.  HENT:  Positive for postnasal drip. Negative for congestion, rhinorrhea, sneezing and sore throat.   Eyes:  Negative for redness.  Respiratory:  Negative for cough, chest tightness and shortness of breath.   Cardiovascular:  Negative for chest pain and palpitations.  Gastrointestinal:  Negative for abdominal pain, constipation, diarrhea, nausea  and vomiting.  Genitourinary:  Negative for dysuria and frequency.  Musculoskeletal:  Negative for arthralgias, back pain, joint swelling and neck pain.  Skin:  Negative for rash.  Neurological: Negative.  Negative for tremors and numbness.  Hematological:  Negative for adenopathy. Does not bruise/bleed easily.  Psychiatric/Behavioral:  Negative for behavioral problems (Depression), sleep disturbance and suicidal ideas. The patient is not nervous/anxious.     Vital Signs: BP (!) 140/64   Pulse 75   Temp 98 F (36.7 C)   Resp 16   Ht 5\' 6"  (1.676 m)   Wt (!) 397 lb (180.1 kg)   SpO2 97%   BMI 64.08 kg/m    Physical Exam Vitals reviewed.  Constitutional:      General: He is not in acute distress.    Appearance: Normal appearance. He is obese. He is not ill-appearing.  HENT:     Head: Normocephalic and atraumatic.  Eyes:     Pupils: Pupils are equal, round, and reactive to light.  Cardiovascular:     Rate and Rhythm: Normal rate and regular rhythm.  Pulmonary:     Effort: Pulmonary effort is normal. No respiratory distress.  Neurological:     Mental Status: He is alert and oriented to person, place, and time.  Psychiatric:        Mood and Affect: Mood normal.        Behavior: Behavior normal.        Assessment/Plan: 1. Essential hypertension Stable, continue lisinopril as prescribed.  - lisinopril (ZESTRIL) 10 MG tablet; Take 1 tablet (10 mg total) by mouth daily.  Dispense: 90 tablet; Refill: 3  2. Acquired hypothyroidism Continue levothyroxine as prescribed - levothyroxine (SYNTHROID) 125 MCG tablet; Take 1 tablet (125 mcg total) by mouth daily.  Dispense: 90 tablet; Refill: 3  3. Morbid obesity with body mass index of 60.0-69.9 in adult [facility]) Lost 5 lbs, continue diethylpropion for another month, follow up in 4 weeks.  - Diethylpropion HCl CR 75 MG TB24; Take 1 tablet (75 mg total) by mouth daily before breakfast.  Dispense: 30 tablet; Refill: 0   General  Counseling: Jeramyah verbalizes understanding of the findings of todays visit and agrees with plan of treatment. I have discussed any further diagnostic evaluation that may be needed or ordered today. We also reviewed his medications today. he has been encouraged to call the office with any questions or concerns that should arise related to todays visit.    No orders of the defined types were placed in this encounter.   Meds ordered this encounter  Medications   Diethylpropion HCl CR 75 MG TB24    Sig: Take 1 tablet (75 mg total) by mouth daily before breakfast.    Dispense:  30 tablet    Refill:  0    Do not  run through insurance, patient will bring good rx coupon   levothyroxine (SYNTHROID) 125 MCG tablet    Sig: Take 1 tablet (125 mcg total) by mouth daily.    Dispense:  90 tablet    Refill:  3   lisinopril (ZESTRIL) 10 MG tablet    Sig: Take 1 tablet (10 mg total) by mouth daily.    Dispense:  90 tablet    Refill:  3    Return in about 4 weeks (around 12/08/2021) for F/U, Weight loss, Joshalyn Ancheta PCP.   Total time spent:30 Minutes Time spent includes review of chart, medications, test results, and follow up plan with the patient.    Controlled Substance Database was reviewed by me.  This patient was seen by Sallyanne Kuster, FNP-C in collaboration with Dr. Beverely Risen as a part of collaborative care agreement.   Preslie Depasquale R. Tedd Sias, MSN, FNP-C Internal medicine

## 2021-11-14 ENCOUNTER — Encounter: Payer: Self-pay | Admitting: Nurse Practitioner

## 2021-12-08 ENCOUNTER — Ambulatory Visit: Payer: BC Managed Care – PPO | Admitting: Nurse Practitioner

## 2021-12-23 ENCOUNTER — Ambulatory Visit: Payer: BC Managed Care – PPO | Admitting: Nurse Practitioner

## 2022-01-09 ENCOUNTER — Encounter: Payer: Self-pay | Admitting: Nurse Practitioner

## 2022-04-18 ENCOUNTER — Telehealth: Payer: Self-pay | Admitting: Nurse Practitioner

## 2022-04-18 NOTE — Telephone Encounter (Signed)
Lvm to schedule ED follow up-Toni 

## 2022-04-20 ENCOUNTER — Telehealth: Payer: Self-pay | Admitting: Nurse Practitioner

## 2022-04-20 NOTE — Telephone Encounter (Signed)
S/w patient's wife regarding ED follow up, she stated patient had hernia surgery, will follow up with surgeon-Toni

## 2022-04-20 NOTE — Telephone Encounter (Signed)
Left another vm to schedule ED follow up-Toni

## 2022-05-03 ENCOUNTER — Encounter: Payer: BC Managed Care – PPO | Admitting: Nurse Practitioner

## 2022-05-16 ENCOUNTER — Encounter: Payer: Self-pay | Admitting: Nurse Practitioner

## 2022-05-16 ENCOUNTER — Ambulatory Visit (INDEPENDENT_AMBULATORY_CARE_PROVIDER_SITE_OTHER): Payer: BC Managed Care – PPO | Admitting: Nurse Practitioner

## 2022-05-16 VITALS — BP 150/85 | HR 80 | Temp 98.0°F | Resp 16 | Ht 66.0 in | Wt >= 6400 oz

## 2022-05-16 DIAGNOSIS — I10 Essential (primary) hypertension: Secondary | ICD-10-CM

## 2022-05-16 DIAGNOSIS — L03116 Cellulitis of left lower limb: Secondary | ICD-10-CM

## 2022-05-16 DIAGNOSIS — Z9189 Other specified personal risk factors, not elsewhere classified: Secondary | ICD-10-CM

## 2022-05-16 DIAGNOSIS — Z6841 Body Mass Index (BMI) 40.0 and over, adult: Secondary | ICD-10-CM

## 2022-05-16 DIAGNOSIS — K439 Ventral hernia without obstruction or gangrene: Secondary | ICD-10-CM

## 2022-05-16 DIAGNOSIS — E039 Hypothyroidism, unspecified: Secondary | ICD-10-CM

## 2022-05-16 DIAGNOSIS — Z125 Encounter for screening for malignant neoplasm of prostate: Secondary | ICD-10-CM

## 2022-05-16 DIAGNOSIS — Z0001 Encounter for general adult medical examination with abnormal findings: Secondary | ICD-10-CM

## 2022-05-16 MED ORDER — DIETHYLPROPION HCL ER 75 MG PO TB24: 75.0000 mg | ORAL_TABLET | Freq: Every day | ORAL | 0 refills | Status: DC

## 2022-05-16 MED ORDER — DOXYCYCLINE HYCLATE 100 MG PO TABS: 100.0000 mg | ORAL_TABLET | Freq: Two times a day (BID) | ORAL | 1 refills | Status: DC

## 2022-05-16 MED ORDER — AMOXICILLIN-POT CLAVULANATE 875-125 MG PO TABS: 1.0000 | ORAL_TABLET | Freq: Two times a day (BID) | ORAL | 2 refills | Status: DC

## 2022-05-16 NOTE — Progress Notes (Signed)
[facility] [place], [place] [address]  Internal MEDICINE  Office Visit Note  Patient Name: Isaiah Douglas  [id number]  [id number]  Date of Service: 05/16/2022  Chief Complaint  Patient presents with   Annual Exam    HPI Jocob presents for a follow-up visit for weight loss management Recent hernia surgery, healing up nice Recent covid infection, improving Wants to restart diethylpropion for weight loss.  Weight at gen surg office was 394 lbs.  Hasa cold today. Was scheduled for annual physical but wants to reschedule that and have today as a follow up only.    Current Medication: Outpatient Encounter Medications as of 05/16/2022  Medication Sig   aspirin EC 81 MG tablet Take 81 mg by mouth daily.   levothyroxine (SYNTHROID) 125 MCG tablet Take 1 tablet (125 mcg total) by mouth daily.   lisinopril (ZESTRIL) 10 MG tablet Take 1 tablet (10 mg total) by mouth daily.   tiZANidine (ZANAFLEX) 2 MG tablet Take 1 tablet (2 mg total) by mouth at bedtime as needed for muscle spasms.   [DISCONTINUED] amoxicillin-clavulanate (AUGMENTIN) 875-125 MG tablet Take 1 tablet by mouth 2 (two) times daily.   [DISCONTINUED] Diethylpropion HCl CR 75 MG TB24 Take 1 tablet (75 mg total) by mouth daily before breakfast.   [DISCONTINUED] doxycycline (VIBRA-TABS) 100 MG tablet Take 1 tablet (100 mg total) by mouth 2 (two) times daily.   amoxicillin-clavulanate (AUGMENTIN) 875-125 MG tablet Take 1 tablet by mouth 2 (two) times daily.   Diethylpropion HCl CR 75 MG TB24 Take 1 tablet (75 mg total) by mouth daily before breakfast.   [START ON 06/13/2022] Diethylpropion HCl CR 75 MG TB24 Take 1 tablet (75 mg total) by mouth daily before breakfast.   doxycycline (VIBRA-TABS) 100 MG tablet Take 1 tablet (100 mg total) by mouth 2 (two) times daily.   No facility-administered encounter medications on file as of 05/16/2022.    Surgical History: Past Surgical History:  Procedure Laterality  Date   CHOLECYSTECTOMY, LAPAROSCOPIC     GASTRIC BYPASS     HERNIA REPAIR      Medical History: Past Medical History:  Diagnosis Date   Hypothyroidism    Lymphedema    Morbid obesity due to excess calories ([place])     Family History: Family History  Problem Relation Age of Onset   Heart disease Mother    Hyperlipidemia Mother    Diabetes Mother    Hypertension Mother     Social History   Socioeconomic History   Marital status: Married    Spouse name: Not on file   Number of children: Not on file   Years of education: Not on file   Highest education level: Not on file  Occupational History   Not on file  Tobacco Use   Smoking status: Never   Smokeless tobacco: Never  Substance and Sexual Activity   Alcohol use: Yes    Comment: occational   Drug use: Never   Sexual activity: Not on file  Other Topics Concern   Not on file  Social History Narrative   Not on file   Social Determinants of Health   Financial Resource Strain: Not on file  Food Insecurity: Not on file  Transportation Needs: Not on file  Physical Activity: Not on file  Stress: Not on file  Social Connections: Not on file  Intimate Partner Violence: Not on file      Review of Systems  Constitutional:  Negative for chills,  fatigue and unexpected weight change.  HENT:  Positive for postnasal drip. Negative for congestion, rhinorrhea, sneezing and sore throat.   Eyes:  Negative for redness.  Respiratory:  Negative for cough, chest tightness and shortness of breath.   Cardiovascular:  Negative for chest pain and palpitations.  Gastrointestinal:  Negative for abdominal pain, constipation, diarrhea, nausea and vomiting.  Genitourinary:  Negative for dysuria and frequency.  Musculoskeletal:  Negative for arthralgias, back pain, joint swelling and neck pain.  Skin:  Negative for rash.  Neurological: Negative.  Negative for tremors and numbness.  Hematological:  Negative for adenopathy. Does not  bruise/bleed easily.  Psychiatric/Behavioral:  Negative for behavioral problems (Depression), sleep disturbance and suicidal ideas. The patient is not nervous/anxious.     Vital Signs: BP (!) 150/85 Comment: 171/75  Pulse 80   Temp 98 F (36.7 C)   Resp 16   Ht '5\' 6"'$  (1.676 m)   Wt (!) 403 lb 6.4 oz (183 kg)   SpO2 95%   BMI 65.11 kg/m    Physical Exam Vitals reviewed.  Constitutional:      General: He is not in acute distress.    Appearance: Normal appearance. He is obese. He is not ill-appearing.  HENT:     Head: Normocephalic and atraumatic.  Eyes:     Pupils: Pupils are equal, round, and reactive to light.  Cardiovascular:     Rate and Rhythm: Normal rate and regular rhythm.  Pulmonary:     Effort: Pulmonary effort is normal. No respiratory distress.  Neurological:     Mental Status: He is alert and oriented to person, place, and time.  Psychiatric:        Mood and Affect: Mood normal.        Behavior: Behavior normal.        Assessment/Plan: 1. Acquired hypothyroidism Continue current dose of levothyroxine, will repeat labs and check thyroid hormone levels at upcoming annual physical.   2. Essential hypertension Slightly elevated. Continue lisinopril as prescribed. If still elevated at next visit, may increase the dose.   3. Morbid obesity with body mass index of 60.0-69.9 in adult (HCC) Refills x 2, follow up in about 8 weeks  - Diethylpropion HCl CR 75 MG TB24; Take 1 tablet (75 mg total) by mouth daily before breakfast.  Dispense: 30 tablet; Refill: 0 - Diethylpropion HCl CR 75 MG TB24; Take 1 tablet (75 mg total) by mouth daily before breakfast.  Dispense: 30 tablet; Refill: 0  4. At high risk for infection Prophylactic antibiotics to have on hand in case of cellulitis infection  - amoxicillin-clavulanate (AUGMENTIN) 875-125 MG tablet; Take 1 tablet by mouth 2 (two) times daily.  Dispense: 28 tablet; Refill: 2 - doxycycline (VIBRA-TABS) 100 MG tablet;  Take 1 tablet (100 mg total) by mouth 2 (two) times daily.  Dispense: 60 tablet; Refill: 1   General Counseling: Cael verbalizes understanding of the findings of todays visit and agrees with plan of treatment. I have discussed any further diagnostic evaluation that may be needed or ordered today. We also reviewed his medications today. he has been encouraged to call the office with any questions or concerns that should arise related to todays visit.    No orders of the defined types were placed in this encounter.   Meds ordered this encounter  Medications   Diethylpropion HCl CR 75 MG TB24    Sig: Take 1 tablet (75 mg total) by mouth daily before breakfast.    Dispense:  30 tablet    Refill:  0    Do not run through insurance, patient will bring good rx coupon   Diethylpropion HCl CR 75 MG TB24    Sig: Take 1 tablet (75 mg total) by mouth daily before breakfast.    Dispense:  30 tablet    Refill:  0    Do not run through insurance, patient will bring good rx coupon   amoxicillin-clavulanate (AUGMENTIN) 875-125 MG tablet    Sig: Take 1 tablet by mouth 2 (two) times daily.    Dispense:  28 tablet    Refill:  2   doxycycline (VIBRA-TABS) 100 MG tablet    Sig: Take 1 tablet (100 mg total) by mouth 2 (two) times daily.    Dispense:  60 tablet    Refill:  1    Return in about 8 weeks (around 07/11/2022) for CPE, Kalifa Cadden PCP and weight loss follow up .   Total time spent:30 Minutes Time spent includes review of chart, medications, test results, and follow up plan with the patient.   [place] Controlled Substance Database was reviewed by me.  This patient was seen by Jonetta Osgood, FNP-C in collaboration with Dr. Clayborn Bigness as a part of collaborative care agreement.   Ashon Rosenberg R. Valetta Fuller, MSN, FNP-C Internal medicine

## 2022-05-16 NOTE — Progress Notes (Deleted)
[facility] [place],  [id number]  Internal MEDICINE  Office Visit Note  Patient Name: Isaiah Douglas  [id number]  [id number]  Date of Service: 05/16/2022  Chief Complaint  Patient presents with   Annual Exam    HPI Isaiah Douglas presents for an annual well visit and physical exam.  Well-appearing 57 y.o. male with  Routine CRC screening: Labs:  New or worsening pain: Other concerns:    Current Medication: Outpatient Encounter Medications as of 05/16/2022  Medication Sig   amoxicillin-clavulanate (AUGMENTIN) 875-125 MG tablet Take 1 tablet by mouth 2 (two) times daily.   aspirin EC 81 MG tablet Take 81 mg by mouth daily.   Diethylpropion HCl CR 75 MG TB24 Take 1 tablet (75 mg total) by mouth daily before breakfast.   doxycycline (VIBRA-TABS) 100 MG tablet Take 1 tablet (100 mg total) by mouth 2 (two) times daily.   levothyroxine (SYNTHROID) 125 MCG tablet Take 1 tablet (125 mcg total) by mouth daily.   lisinopril (ZESTRIL) 10 MG tablet Take 1 tablet (10 mg total) by mouth daily.   tiZANidine (ZANAFLEX) 2 MG tablet Take 1 tablet (2 mg total) by mouth at bedtime as needed for muscle spasms.   No facility-administered encounter medications on file as of 05/16/2022.    Surgical History: Past Surgical History:  Procedure Laterality Date   CHOLECYSTECTOMY, LAPAROSCOPIC     GASTRIC BYPASS     HERNIA REPAIR      Medical History: Past Medical History:  Diagnosis Date   Hypothyroidism    Lymphedema    Morbid obesity due to excess calories ([place])     Family History: Family History  Problem Relation Age of Onset   Heart disease Mother    Hyperlipidemia Mother    Diabetes Mother    Hypertension Mother     Social History   Socioeconomic History   Marital status: Married    Spouse name: Not on file   Number of children: Not on file   Years of education: Not on file   Highest education level: Not on file  Occupational History   Not on file   Tobacco Use   Smoking status: Never   Smokeless tobacco: Never  Substance and Sexual Activity   Alcohol use: Yes    Comment: occational   Drug use: Never   Sexual activity: Not on file  Other Topics Concern   Not on file  Social History Narrative   Not on file   Social Determinants of Health   Financial Resource Strain: Not on file  Food Insecurity: Not on file  Transportation Needs: Not on file  Physical Activity: Not on file  Stress: Not on file  Social Connections: Not on file  Intimate Partner Violence: Not on file      Review of Systems  Vital Signs: BP (!) 171/75   Pulse 80   Temp 98 F (36.7 C)   Resp 16   Ht 5' 6"$  (1.676 m)   Wt (!) 403 lb 6.4 oz (183 kg)   SpO2 95%   BMI 65.11 kg/m    Physical Exam     Assessment/Plan: 1. Encounter for routine adult health examination with abnormal findings  2. Essential hypertension  3. Acquired hypothyroidism  4. Morbid obesity with body mass index of 60.0-69.9 in adult ([place])  5. Ventral hernia without obstruction or gangrene     General Counseling: Isaiah Douglas verbalizes understanding of the findings of todays visit and agrees with plan  of treatment. I have discussed any further diagnostic evaluation that may be needed or ordered today. We also reviewed his medications today. he has been encouraged to call the office with any questions or concerns that should arise related to todays visit.    No orders of the defined types were placed in this encounter.   No orders of the defined types were placed in this encounter.   No follow-ups on file.   Total time spent:*** Minutes Time spent includes review of chart, medications, test results, and follow up plan with the patient.   [place] Controlled Substance Database was reviewed by me.  This patient was seen by Isaiah Osgood, FNP-C in collaboration with Dr. Clayborn Douglas as a part of collaborative care agreement.  Isaiah Warchol R. Valetta Fuller, MSN, FNP-C Internal  medicine

## 2022-05-22 ENCOUNTER — Encounter: Payer: Self-pay | Admitting: Nurse Practitioner

## 2022-06-09 ENCOUNTER — Other Ambulatory Visit: Payer: Self-pay

## 2022-06-09 ENCOUNTER — Telehealth: Payer: Self-pay

## 2022-06-09 DIAGNOSIS — E039 Hypothyroidism, unspecified: Secondary | ICD-10-CM

## 2022-06-09 NOTE — Telephone Encounter (Signed)
Lmom to call us back for levothyroxine dose

## 2022-07-11 ENCOUNTER — Ambulatory Visit: Payer: BC Managed Care – PPO | Admitting: Nurse Practitioner

## 2022-07-28 ENCOUNTER — Other Ambulatory Visit: Payer: Self-pay | Admitting: Nurse Practitioner

## 2022-07-29 ENCOUNTER — Telehealth: Payer: Self-pay | Admitting: Nurse Practitioner

## 2022-07-29 NOTE — Telephone Encounter (Signed)
Lvm to re-sch no show appt-nm 

## 2022-12-09 ENCOUNTER — Other Ambulatory Visit: Payer: Self-pay | Admitting: Nurse Practitioner

## 2022-12-09 ENCOUNTER — Telehealth: Payer: Self-pay

## 2022-12-09 DIAGNOSIS — I10 Essential (primary) hypertension: Secondary | ICD-10-CM

## 2022-12-09 NOTE — Telephone Encounter (Signed)
Send letter that pt need appt for med refills and also send message to phar pt need appt for refills sent 30 days med

## 2023-01-24 ENCOUNTER — Ambulatory Visit (INDEPENDENT_AMBULATORY_CARE_PROVIDER_SITE_OTHER): Payer: BC Managed Care – PPO | Admitting: Nurse Practitioner

## 2023-01-24 ENCOUNTER — Encounter: Payer: Self-pay | Admitting: Nurse Practitioner

## 2023-01-24 VITALS — BP 138/85 | HR 64 | Temp 98.4°F | Resp 16 | Ht 66.0 in | Wt >= 6400 oz

## 2023-01-24 DIAGNOSIS — E039 Hypothyroidism, unspecified: Secondary | ICD-10-CM | POA: Diagnosis not present

## 2023-01-24 DIAGNOSIS — Z6841 Body Mass Index (BMI) 40.0 and over, adult: Secondary | ICD-10-CM

## 2023-01-24 DIAGNOSIS — I10 Essential (primary) hypertension: Secondary | ICD-10-CM

## 2023-01-24 MED ORDER — LISINOPRIL 10 MG PO TABS: 10.0000 mg | ORAL_TABLET | Freq: Every day | ORAL | 3 refills | Status: DC

## 2023-01-24 MED ORDER — LEVOTHYROXINE SODIUM 125 MCG PO TABS: 125.0000 ug | ORAL_TABLET | Freq: Every day | ORAL | 3 refills | Status: DC

## 2023-01-24 MED ORDER — DIETHYLPROPION HCL ER 75 MG PO TB24: 75.0000 mg | ORAL_TABLET | Freq: Every day | ORAL | 1 refills | Status: DC

## 2023-01-24 NOTE — Progress Notes (Signed)
[facility] [address] [place], [place] [address]  Internal MEDICINE  Office Visit Note  Patient Name: Isaiah Douglas  [id number]  [id number]  Date of Service: 01/24/2023  Chief Complaint  Patient presents with   Follow-up    HPI Isaiah Douglas presents for a follow-up visit for hypertension, weight loss and hypothyroidism.  Hyprtension -- BP elevated, on lisinopril Weight loss -- gained 15 lbs since last visit, wants to start back on diethylpropion  Hypothyroidism -- ran out of medication last week, needs refills.     Current Medication: Outpatient Encounter Medications as of 01/24/2023  Medication Sig   amoxicillin-clavulanate (AUGMENTIN) 875-125 MG tablet Take 1 tablet by mouth 2 (two) times daily.   aspirin EC 81 MG tablet Take 81 mg by mouth daily.   doxycycline (VIBRA-TABS) 100 MG tablet Take 1 tablet (100 mg total) by mouth 2 (two) times daily.   [DISCONTINUED] Diethylpropion HCl CR 75 MG TB24 Take 1 tablet (75 mg total) by mouth daily before breakfast.   [DISCONTINUED] Diethylpropion HCl CR 75 MG TB24 Take 1 tablet (75 mg total) by mouth daily before breakfast.   [DISCONTINUED] levothyroxine (SYNTHROID) 125 MCG tablet Take 1 tablet (125 mcg total) by mouth daily.   [DISCONTINUED] lisinopril (ZESTRIL) 10 MG tablet Take 1 tablet by mouth once daily   [DISCONTINUED] tiZANidine (ZANAFLEX) 2 MG tablet Take 1 tablet (2 mg total) by mouth at bedtime as needed for muscle spasms.   Diethylpropion HCl CR 75 MG TB24 Take 1 tablet (75 mg total) by mouth daily before breakfast.   levothyroxine (SYNTHROID) 125 MCG tablet Take 1 tablet (125 mcg total) by mouth daily.   lisinopril (ZESTRIL) 10 MG tablet Take 1 tablet (10 mg total) by mouth daily.   No facility-administered encounter medications on file as of 01/24/2023.    Surgical History: Past Surgical History:  Procedure Laterality Date   CHOLECYSTECTOMY, LAPAROSCOPIC     GASTRIC BYPASS     HERNIA REPAIR      Medical  History: Past Medical History:  Diagnosis Date   Hypothyroidism    Lymphedema    Morbid obesity due to excess calories ([facility])     Family History: Family History  Problem Relation Age of Onset   Heart disease Mother    Hyperlipidemia Mother    Diabetes Mother    Hypertension Mother     Social History   Socioeconomic History   Marital status: Married    Spouse name: Not on file   Number of children: Not on file   Years of education: Not on file   Highest education level: Not on file  Occupational History   Not on file  Tobacco Use   Smoking status: Never   Smokeless tobacco: Never  Substance and Sexual Activity   Alcohol use: Yes    Comment: occational   Drug use: Never   Sexual activity: Not on file  Other Topics Concern   Not on file  Social History Narrative   Not on file   Social Determinants of Health   Financial Resource Strain: Low Risk  (12/10/2019)   Received from [facility], [facility]   Overall Financial Resource Strain (CARDIA)    Difficulty of Paying Living Expenses: Not very hard  Food Insecurity: No Food Insecurity (12/10/2019)   Received from [facility], [facility]   Hunger Vital Sign    Worried About Running Out of Food in the Last Year: Never true  Ran Out of Food in the Last Year: Never true  Transportation Needs: No Transportation Needs (12/10/2019)   Received from [facility], [facility]   [facility] - Transportation    Lack of Transportation (Medical): No    Lack of Transportation (Non-Medical): No  Physical Activity: Not on file  Stress: Not on file  Social Connections: Not on file  Intimate Partner Violence: Not on file      Review of Systems  Constitutional:  Negative for chills, fatigue and unexpected weight change.  HENT:  Positive for postnasal drip. Negative for congestion, rhinorrhea, sneezing and sore throat.   Eyes:  Negative for redness.  Respiratory:  Negative for cough, chest tightness and  shortness of breath.   Cardiovascular:  Negative for chest pain and palpitations.  Gastrointestinal:  Negative for abdominal pain, constipation, diarrhea, nausea and vomiting.  Genitourinary:  Negative for dysuria and frequency.  Musculoskeletal:  Negative for arthralgias, back pain, joint swelling and neck pain.  Skin:  Negative for rash.  Neurological: Negative.  Negative for tremors and numbness.  Hematological:  Negative for adenopathy. Does not bruise/bleed easily.  Psychiatric/Behavioral:  Negative for behavioral problems (Depression), sleep disturbance and suicidal ideas. The patient is not nervous/anxious.     Vital Signs: BP 138/85 Comment: 175/77  Pulse 64   Temp 98.4 F (36.9 C)   Resp 16   Ht 5\' 6"  (1.676 m)   Wt (!) 418 lb (189.6 kg)   SpO2 96%   BMI 67.47 kg/m    Physical Exam Vitals reviewed.  Constitutional:      General: He is not in acute distress.    Appearance: Normal appearance. He is obese. He is not ill-appearing.  HENT:     Head: Normocephalic and atraumatic.  Eyes:     Pupils: Pupils are equal, round, and reactive to light.  Cardiovascular:     Rate and Rhythm: Normal rate and regular rhythm.  Pulmonary:     Effort: Pulmonary effort is normal. No respiratory distress.  Neurological:     Mental Status: He is alert and oriented to person, place, and time.  Psychiatric:        Mood and Affect: Mood normal.        Behavior: Behavior normal.        Assessment/Plan: 1. Essential hypertension (Primary) Stable, continue lisinopril as prescribed.  - lisinopril (ZESTRIL) 10 MG tablet; Take 1 tablet (10 mg total) by mouth daily.  Dispense: 90 tablet; Refill: 3  2. Acquired hypothyroidism Continue levothyroxine as prescribed.  - levothyroxine (SYNTHROID) 125 MCG tablet; Take 1 tablet (125 mcg total) by mouth daily.  Dispense: 90 tablet; Refill: 3  3. Morbid obesity with body mass index of 60.0-69.9 in adult [facility]) Start diethylpropion as  prescribed. Follow up in 8 weeks for weigh in - Diethylpropion HCl CR 75 MG TB24; Take 1 tablet (75 mg total) by mouth daily before breakfast.  Dispense: 30 tablet; Refill: 1   General Counseling: Renner verbalizes understanding of the findings of todays visit and agrees with plan of treatment. I have discussed any further diagnostic evaluation that may be needed or ordered today. We also reviewed his medications today. he has been encouraged to call the office with any questions or concerns that should arise related to todays visit.    No orders of the defined types were placed in this encounter.   Meds ordered this encounter  Medications   Diethylpropion HCl CR 75 MG TB24  Sig: Take 1 tablet (75 mg total) by mouth daily before breakfast.    Dispense:  30 tablet    Refill:  1    Do not run through insurance, patient will bring good rx coupon   levothyroxine (SYNTHROID) 125 MCG tablet    Sig: Take 1 tablet (125 mcg total) by mouth daily.    Dispense:  90 tablet    Refill:  3   lisinopril (ZESTRIL) 10 MG tablet    Sig: Take 1 tablet (10 mg total) by mouth daily.    Dispense:  90 tablet    Refill:  3    Pt need appt for further refills    Return in about 8 weeks (around 03/20/2023) for F/U, Weight loss, Aleyda Gindlesperger PCP.   Total time spent:30 Minutes Time spent includes review of chart, medications, test results, and follow up plan with the patient.   [place] Controlled Substance Database was reviewed by me.  This patient was seen by Sallyanne Kuster, FNP-C in collaboration with Dr. Beverely Risen as a part of collaborative care agreement.   Roshawn Ayala R. Tedd Sias, MSN, FNP-C Internal medicine

## 2023-03-10 ENCOUNTER — Encounter: Payer: Self-pay | Admitting: Nurse Practitioner

## 2023-03-10 DIAGNOSIS — I10 Essential (primary) hypertension: Secondary | ICD-10-CM | POA: Insufficient documentation

## 2023-03-20 ENCOUNTER — Ambulatory Visit: Payer: BC Managed Care – PPO | Admitting: Nurse Practitioner

## 2023-04-27 ENCOUNTER — Encounter: Payer: Self-pay | Admitting: Nurse Practitioner

## 2023-04-27 ENCOUNTER — Ambulatory Visit (INDEPENDENT_AMBULATORY_CARE_PROVIDER_SITE_OTHER): Payer: 59 | Admitting: Physician Assistant

## 2023-04-27 VITALS — BP 156/66 | HR 79 | Temp 98.4°F | Resp 16 | Ht 66.0 in | Wt >= 6400 oz

## 2023-04-27 DIAGNOSIS — E782 Mixed hyperlipidemia: Secondary | ICD-10-CM | POA: Diagnosis not present

## 2023-04-27 DIAGNOSIS — R5383 Other fatigue: Secondary | ICD-10-CM

## 2023-04-27 DIAGNOSIS — Z131 Encounter for screening for diabetes mellitus: Secondary | ICD-10-CM

## 2023-04-27 DIAGNOSIS — I10 Essential (primary) hypertension: Secondary | ICD-10-CM

## 2023-04-27 DIAGNOSIS — Z125 Encounter for screening for malignant neoplasm of prostate: Secondary | ICD-10-CM

## 2023-04-27 DIAGNOSIS — E538 Deficiency of other specified B group vitamins: Secondary | ICD-10-CM | POA: Diagnosis not present

## 2023-04-27 DIAGNOSIS — Z9189 Other specified personal risk factors, not elsewhere classified: Secondary | ICD-10-CM

## 2023-04-27 DIAGNOSIS — Z6841 Body Mass Index (BMI) 40.0 and over, adult: Secondary | ICD-10-CM

## 2023-04-27 DIAGNOSIS — E039 Hypothyroidism, unspecified: Secondary | ICD-10-CM | POA: Diagnosis not present

## 2023-04-27 DIAGNOSIS — E559 Vitamin D deficiency, unspecified: Secondary | ICD-10-CM

## 2023-04-27 MED ORDER — DIETHYLPROPION HCL ER 75 MG PO TB24: 75.0000 mg | ORAL_TABLET | Freq: Every day | ORAL | 0 refills | Status: DC

## 2023-04-27 MED ORDER — AMOXICILLIN-POT CLAVULANATE 875-125 MG PO TABS: 1.0000 | ORAL_TABLET | Freq: Two times a day (BID) | ORAL | 2 refills | Status: DC

## 2023-04-27 NOTE — Progress Notes (Signed)
[facility] [address] [place], [place] [address]  Internal MEDICINE  Office Visit Note  Patient Name: Isaiah Douglas  [id number]  [id number]  Date of Service: 05/07/2023  Chief Complaint  Patient presents with   Follow-up    Weight loss    HPI Pt is here for routine follow up for wt loss -Had a cold recently and some ear pressure on the left ear. Sinuses felt ok and no coughing. Lingering the past few weeks -Has been out of medication now for wt loss and would like refill. Discussed possibility of GLP1 and he will call insurance to see if any coverage for wt loss meds and let office know. No hx of thyroid Cancer, just hypothyroid -Does keep augmentin on hand in case of  leg flares, nothing recently. Needs refill -also due for labs -BP high in office, discussed monitoring at home  Current Medication: Outpatient Encounter Medications as of 04/27/2023  Medication Sig   aspirin EC 81 MG tablet Take 81 mg by mouth daily.   levothyroxine (SYNTHROID) 125 MCG tablet Take 1 tablet (125 mcg total) by mouth daily.   lisinopril (ZESTRIL) 10 MG tablet Take 1 tablet (10 mg total) by mouth daily.   [DISCONTINUED] amoxicillin-clavulanate (AUGMENTIN) 875-125 MG tablet Take 1 tablet by mouth 2 (two) times daily.   [DISCONTINUED] Diethylpropion HCl CR 75 MG TB24 Take 1 tablet (75 mg total) by mouth daily before breakfast.   [DISCONTINUED] doxycycline (VIBRA-TABS) 100 MG tablet Take 1 tablet (100 mg total) by mouth 2 (two) times daily.   amoxicillin-clavulanate (AUGMENTIN) 875-125 MG tablet Take 1 tablet by mouth 2 (two) times daily.   Diethylpropion HCl CR 75 MG TB24 Take 1 tablet (75 mg total) by mouth daily before breakfast.   No facility-administered encounter medications on file as of 04/27/2023.    Surgical History: Past Surgical History:  Procedure Laterality Date   CHOLECYSTECTOMY, LAPAROSCOPIC     GASTRIC BYPASS     HERNIA REPAIR      Medical History: Past Medical  History:  Diagnosis Date   Hypothyroidism    Lymphedema    Morbid obesity due to excess calories ([facility])     Family History: Family History  Problem Relation Age of Onset   Heart disease Mother    Hyperlipidemia Mother    Diabetes Mother    Hypertension Mother     Social History   Socioeconomic History   Marital status: Married    Spouse name: Not on file   Number of children: Not on file   Years of education: Not on file   Highest education level: Not on file  Occupational History   Not on file  Tobacco Use   Smoking status: Never   Smokeless tobacco: Never  Substance and Sexual Activity   Alcohol use: Yes    Comment: occational   Drug use: Never   Sexual activity: Not on file  Other Topics Concern   Not on file  Social History Narrative   Not on file   Social Drivers of Health   Financial Resource Strain: Low Risk  (12/10/2019)   Received from [facility], [facility]   Overall Financial Resource Strain (CARDIA)    Difficulty of Paying Living Expenses: Not very hard  Food Insecurity: No Food Insecurity (12/10/2019)   Received from [facility], [facility]   Hunger Vital Sign    Worried About Running Out of Food in the Last Year: Never true  Ran Out of Food in the Last Year: Never true  Transportation Needs: No Transportation Needs (12/10/2019)   Received from [facility], [facility]   [facility] - Transportation    Lack of Transportation (Medical): No    Lack of Transportation (Non-Medical): No  Physical Activity: Not on file  Stress: Not on file  Social Connections: Not on file  Intimate Partner Violence: Not on file      Review of Systems  Constitutional:  Negative for chills, fatigue and unexpected weight change.  HENT:  Positive for postnasal drip. Negative for congestion, rhinorrhea, sneezing and sore throat.        Left ear pressure  Eyes:  Negative for redness.  Respiratory:  Negative for cough, chest tightness and  shortness of breath.   Cardiovascular:  Negative for chest pain and palpitations.  Gastrointestinal:  Negative for abdominal pain, constipation, diarrhea, nausea and vomiting.  Genitourinary:  Negative for dysuria and frequency.  Musculoskeletal:  Negative for arthralgias, back pain, joint swelling and neck pain.  Skin:  Negative for rash.  Neurological: Negative.  Negative for tremors and numbness.  Hematological:  Negative for adenopathy. Does not bruise/bleed easily.  Psychiatric/Behavioral:  Negative for behavioral problems (Depression), sleep disturbance and suicidal ideas. The patient is not nervous/anxious.     Vital Signs: BP (!) 156/66   Pulse 79   Temp 98.4 F (36.9 C)   Resp 16   Ht 5\' 6"  (1.676 m)   Wt (!) 419 lb (190.1 kg)   SpO2 97%   BMI 67.63 kg/m    Physical Exam Vitals reviewed.  Constitutional:      General: He is not in acute distress.    Appearance: Normal appearance. He is obese. He is not ill-appearing.  HENT:     Head: Normocephalic and atraumatic.     Right Ear: Tympanic membrane normal.     Left Ear: Tympanic membrane normal.  Eyes:     Pupils: Pupils are equal, round, and reactive to light.  Cardiovascular:     Rate and Rhythm: Normal rate and regular rhythm.  Pulmonary:     Effort: Pulmonary effort is normal. No respiratory distress.  Neurological:     Mental Status: He is alert and oriented to person, place, and time.  Psychiatric:        Mood and Affect: Mood normal.        Behavior: Behavior normal.        Assessment/Plan: 1. Essential hypertension (Primary) Elevated in office, discussed monitoring at home  2. Acquired hypothyroidism Will update labs - TSH + free T4  3. Mixed hyperlipidemia - Lipid Panel With LDL/HDL Ratio  4. B12 deficiency - B12 and Folate Panel  5. Vitamin D deficiency - VITAMIN D 25 Hydroxy (Vit-D Deficiency, Fractures)  6. Special screening for malignant neoplasm of prostate - PSA Total (Reflex To  Free)  7. Diabetes mellitus screening - Hgb A1C w/o eAG  8. At high risk for infection - amoxicillin-clavulanate (AUGMENTIN) 875-125 MG tablet; Take 1 tablet by mouth 2 (two) times daily.  Dispense: 28 tablet; Refill: 2  9. Other fatigue - TSH + free T4 - PSA Total (Reflex To Free) - Lipid Panel With LDL/HDL Ratio - CBC w/Diff/Platelet - Comprehensive metabolic panel - M57 and Folate Panel - VITAMIN D 25 Hydroxy (Vit-D Deficiency, Fractures)  10. Morbid obesity with body mass index of 60.0-69.9 in adult [facility]) Refill of short term medication, will call insurance to see if GLP1 is  an option. Work on diet and exercise - Diethylpropion HCl CR 75 MG TB24; Take 1 tablet (75 mg total) by mouth daily before breakfast.  Dispense: 30 tablet; Refill: 0   General Counseling: Vannak verbalizes understanding of the findings of todays visit and agrees with plan of treatment. I have discussed any further diagnostic evaluation that may be needed or ordered today. We also reviewed his medications today. he has been encouraged to call the office with any questions or concerns that should arise related to todays visit.    Orders Placed This Encounter  Procedures   TSH + free T4   PSA Total (Reflex To Free)   Lipid Panel With LDL/HDL Ratio   CBC w/Diff/Platelet   Comprehensive metabolic panel   J81 and Folate Panel   VITAMIN D 25 Hydroxy (Vit-D Deficiency, Fractures)   Hgb A1C w/o eAG    Meds ordered this encounter  Medications   amoxicillin-clavulanate (AUGMENTIN) 875-125 MG tablet    Sig: Take 1 tablet by mouth 2 (two) times daily.    Dispense:  28 tablet    Refill:  2   Diethylpropion HCl CR 75 MG TB24    Sig: Take 1 tablet (75 mg total) by mouth daily before breakfast.    Dispense:  30 tablet    Refill:  0    Do not run through insurance, patient will bring good rx coupon    This patient was seen by Lynn Ito, PA-C in collaboration with Dr. Beverely Risen as a part of  collaborative care agreement.   Total time spent:30 Minutes Time spent includes review of chart, medications, test results, and follow up plan with the patient.      Dr Lyndon Code Internal medicine

## 2023-05-05 LAB — COMPREHENSIVE METABOLIC PANEL
ALT: 18 [IU]/L (ref 0–44)
AST: 20 [IU]/L (ref 0–40)
Albumin: 3.8 g/dL (ref 3.8–4.9)
Alkaline Phosphatase: 63 [IU]/L (ref 44–121)
BUN/Creatinine Ratio: 17 (ref 9–20)
BUN: 17 mg/dL (ref 6–24)
Bilirubin Total: 0.4 mg/dL (ref 0.0–1.2)
CO2: 22 mmol/L (ref 20–29)
Calcium: 8.8 mg/dL (ref 8.7–10.2)
Chloride: 105 mmol/L (ref 96–106)
Creatinine, Ser: 1.01 mg/dL (ref 0.76–1.27)
Globulin, Total: 2.5 g/dL (ref 1.5–4.5)
Glucose: 93 mg/dL (ref 70–99)
Potassium: 4.7 mmol/L (ref 3.5–5.2)
Sodium: 142 mmol/L (ref 134–144)
Total Protein: 6.3 g/dL (ref 6.0–8.5)
eGFR: 86 mL/min/{1.73_m2} (ref >59)

## 2023-05-05 LAB — PSA TOTAL (REFLEX TO FREE): Prostate Specific Ag, Serum: 0.2 ng/mL (ref 0.0–4.0)

## 2023-05-05 LAB — CBC WITH DIFFERENTIAL/PLATELET
Basophils Absolute: 0.1 10*3/uL (ref 0.0–0.2)
Basos: 1 %
EOS (ABSOLUTE): 0.3 10*3/uL (ref 0.0–0.4)
Eos: 4 %
Hematocrit: 42.2 % (ref 37.5–51.0)
Hemoglobin: 14.3 g/dL (ref 13.0–17.7)
Immature Grans (Abs): 0 10*3/uL (ref 0.0–0.1)
Immature Granulocytes: 0 %
Lymphocytes Absolute: 2.2 10*3/uL (ref 0.7–3.1)
Lymphs: 33 %
MCH: 30 pg (ref 26.6–33.0)
MCHC: 33.9 g/dL (ref 31.5–35.7)
MCV: 89 fL (ref 79–97)
Monocytes Absolute: 0.5 10*3/uL (ref 0.1–0.9)
Monocytes: 8 %
Neutrophils Absolute: 3.5 10*3/uL (ref 1.4–7.0)
Neutrophils: 54 %
Platelets: 247 10*3/uL (ref 150–450)
RBC: 4.76 x10E6/uL (ref 4.14–5.80)
RDW: 13.3 % (ref 11.6–15.4)
WBC: 6.6 10*3/uL (ref 3.4–10.8)

## 2023-05-05 LAB — LIPID PANEL WITH LDL/HDL RATIO
Cholesterol, Total: 179 mg/dL (ref 100–199)
HDL: 53 mg/dL (ref >39)
LDL Chol Calc (NIH): 110 mg/dL — ABNORMAL HIGH (ref 0–99)
LDL/HDL Ratio: 2.1 {ratio} (ref 0.0–3.6)
Triglycerides: 86 mg/dL (ref 0–149)
VLDL Cholesterol Cal: 16 mg/dL (ref 5–40)

## 2023-05-05 LAB — VITAMIN D 25 HYDROXY (VIT D DEFICIENCY, FRACTURES): Vit D, 25-Hydroxy: 16.3 ng/mL — ABNORMAL LOW (ref 30.0–100.0)

## 2023-05-05 LAB — HGB A1C W/O EAG: Hgb A1c MFr Bld: 5.4 % (ref 4.8–5.6)

## 2023-05-05 LAB — B12 AND FOLATE PANEL
Folate: 6.9 ng/mL (ref >3.0)
Vitamin B-12: 220 pg/mL — ABNORMAL LOW (ref 232–1245)

## 2023-05-05 LAB — TSH+FREE T4
Free T4: 1.26 ng/dL (ref 0.82–1.77)
TSH: 6.33 u[IU]/mL — ABNORMAL HIGH (ref 0.450–4.500)

## 2023-05-25 ENCOUNTER — Encounter: Payer: Self-pay | Admitting: Nurse Practitioner

## 2023-05-25 ENCOUNTER — Ambulatory Visit: Payer: 59 | Admitting: Nurse Practitioner

## 2023-05-25 VITALS — BP 125/72 | HR 76 | Temp 98.2°F | Resp 16 | Ht 66.0 in | Wt >= 6400 oz

## 2023-05-25 DIAGNOSIS — E782 Mixed hyperlipidemia: Secondary | ICD-10-CM | POA: Diagnosis not present

## 2023-05-25 DIAGNOSIS — E039 Hypothyroidism, unspecified: Secondary | ICD-10-CM

## 2023-05-25 DIAGNOSIS — E538 Deficiency of other specified B group vitamins: Secondary | ICD-10-CM

## 2023-05-25 DIAGNOSIS — I10 Essential (primary) hypertension: Secondary | ICD-10-CM

## 2023-05-25 DIAGNOSIS — R600 Localized edema: Secondary | ICD-10-CM | POA: Diagnosis not present

## 2023-05-25 DIAGNOSIS — Z6841 Body Mass Index (BMI) 40.0 and over, adult: Secondary | ICD-10-CM

## 2023-05-25 DIAGNOSIS — E559 Vitamin D deficiency, unspecified: Secondary | ICD-10-CM | POA: Insufficient documentation

## 2023-05-25 MED ORDER — LEVOTHYROXINE SODIUM 137 MCG PO TABS: 137.0000 ug | ORAL_TABLET | Freq: Every day | ORAL | 1 refills | Status: DC

## 2023-05-25 MED ORDER — CYANOCOBALAMIN 1000 MCG/ML IJ SOLN
1000.0000 ug | Freq: Once | INTRAMUSCULAR | Status: AC
  Administered 2023-05-25: 1000 ug via INTRAMUSCULAR

## 2023-05-25 MED ORDER — DIETHYLPROPION HCL ER 75 MG PO TB24: 75.0000 mg | ORAL_TABLET | Freq: Every day | ORAL | 2 refills | Status: DC

## 2023-05-25 MED ORDER — HYDROCHLOROTHIAZIDE 12.5 MG PO CAPS: 12.5000 mg | ORAL_CAPSULE | Freq: Every day | ORAL | 1 refills | Status: DC | PRN

## 2023-05-25 MED ORDER — VITAMIN D (ERGOCALCIFEROL) 1.25 MG (50000 UNIT) PO CAPS: 50000.0000 [IU] | ORAL_CAPSULE | ORAL | 1 refills | Status: DC

## 2023-05-25 NOTE — Patient Instructions (Signed)
 Start daily OTC B12 supplement 1000 mcg daily

## 2023-05-25 NOTE — Progress Notes (Signed)
 [facility] [address] [place], [place] [address]  Internal MEDICINE  Office Visit Note  Patient Name: Isaiah Douglas  [id number]  [id number]  Date of Service: 05/25/2023  Chief Complaint  Patient presents with   Follow-up    Review labs    HPI Jonty presents for a follow-up visit for hypertension, leg swelling, low B12, hypothyroidism, and low vitamin D Hypertension -- BP controlled with lisinopril  Lower extremity edema -- has swelling in legs that comes and goes, was taking a diuretic several years ago.  B12 deficiency -- not taking any supplement, significantly low at 220 Low vitamin D -- not on any supplement Slightly elevated LDL at 110.  Weight loss -- diethylpropion has helped in the past, would like to continue this medication.    Current Medication: Outpatient Encounter Medications as of 05/25/2023  Medication Sig   aspirin EC 81 MG tablet Take 81 mg by mouth daily.   hydrochlorothiazide (MICROZIDE) 12.5 MG capsule Take 1 capsule (12.5 mg total) by mouth daily as needed (lower extremity swelling).   levothyroxine (SYNTHROID) 137 MCG tablet Take 1 tablet (137 mcg total) by mouth daily before breakfast.   lisinopril (ZESTRIL) 10 MG tablet Take 1 tablet (10 mg total) by mouth daily.   Vitamin D, Ergocalciferol, (DRISDOL) 1.25 MG (50000 UNIT) CAPS capsule Take 1 capsule (50,000 Units total) by mouth every 7 (seven) days.   [DISCONTINUED] amoxicillin-clavulanate (AUGMENTIN) 875-125 MG tablet Take 1 tablet by mouth 2 (two) times daily.   [DISCONTINUED] Diethylpropion HCl CR 75 MG TB24 Take 1 tablet (75 mg total) by mouth daily before breakfast.   [DISCONTINUED] levothyroxine (SYNTHROID) 125 MCG tablet Take 1 tablet (125 mcg total) by mouth daily.   Diethylpropion HCl CR 75 MG TB24 Take 1 tablet (75 mg total) by mouth daily before breakfast.   [EXPIRED] cyanocobalamin (VITAMIN B12) injection 1,000 mcg    No facility-administered encounter medications on file as  of 05/25/2023.    Surgical History: Past Surgical History:  Procedure Laterality Date   CHOLECYSTECTOMY, LAPAROSCOPIC     GASTRIC BYPASS     HERNIA REPAIR      Medical History: Past Medical History:  Diagnosis Date   Hypothyroidism    Lymphedema    Morbid obesity due to excess calories (HCC)     Family History: Family History  Problem Relation Age of Onset   Heart disease Mother    Hyperlipidemia Mother    Diabetes Mother    Hypertension Mother     Social History   Socioeconomic History   Marital status: Married    Spouse name: Not on file   Number of children: Not on file   Years of education: Not on file   Highest education level: Not on file  Occupational History   Not on file  Tobacco Use   Smoking status: Never   Smokeless tobacco: Never  Substance and Sexual Activity   Alcohol use: Yes    Comment: occational   Drug use: Never   Sexual activity: Not on file  Other Topics Concern   Not on file  Social History Narrative   Not on file   Social Drivers of Health   Financial Resource Strain: Low Risk  (12/10/2019)   Received from [facility], [facility]   Overall Financial Resource Strain (CARDIA)    Difficulty of Paying Living Expenses: Not very hard  Food Insecurity: No Food Insecurity (12/10/2019)   [facility]   Hunger Vital Sign    Worried About Running Out of Food in the Last Year: Never true    Ran Out of Food in the Last Year: Never true  Transportation Needs: No Transportation Needs (12/10/2019)   Received from [facility]   [facility] - Transportation    Lack of Transportation (Medical): No    Lack of Transportation (Non-Medical): No  Physical Activity: Not on file  Stress: Not on file  Social Connections: Not on file  Intimate Partner Violence: Not on file      Review of Systems  Constitutional:  Negative for chills, fatigue and unexpected weight change.  HENT:  Negative  for congestion, postnasal drip, rhinorrhea, sneezing and sore throat.   Eyes:  Negative for redness.  Respiratory:  Negative for cough, chest tightness, shortness of breath and wheezing.   Cardiovascular:  Positive for leg swelling. Negative for chest pain and palpitations.  Gastrointestinal:  Negative for abdominal pain, constipation, diarrhea, nausea and vomiting.  Genitourinary:  Negative for dysuria and frequency.  Musculoskeletal:  Negative for arthralgias, back pain, joint swelling and neck pain.  Skin:  Negative for rash.  Neurological: Negative.  Negative for tremors and numbness.  Hematological:  Negative for adenopathy. Does not bruise/bleed easily.  Psychiatric/Behavioral:  Negative for behavioral problems (Depression), sleep disturbance and suicidal ideas. The patient is not nervous/anxious.     Vital Signs: BP 125/72   Pulse 76   Temp 98.2 F (36.8 C)   Resp 16   Ht 5\' 6"  (1.676 m)   Wt (!) 423 lb 9.6 oz (192.1 kg)   SpO2 97%   BMI 68.37 kg/m    Physical Exam Vitals reviewed.  Constitutional:      General: He is not in acute distress.    Appearance: Normal appearance. He is obese. He is not ill-appearing.  HENT:     Head: Normocephalic and atraumatic.  Eyes:     Pupils: Pupils are equal, round, and reactive to light.  Cardiovascular:     Rate and Rhythm: Normal rate and regular rhythm.  Pulmonary:     Effort: Pulmonary effort is normal. No respiratory distress.  Neurological:     Mental Status: He is alert and oriented to person, place, and time.  Psychiatric:        Mood and Affect: Mood normal.        Behavior: Behavior normal.        Assessment/Plan: 1. Essential hypertension (Primary) Stable, continue lisinopril as prescribed.   2. Acquired hypothyroidism Continue levothyroxine as prescribed.  - levothyroxine (SYNTHROID) 137 MCG tablet; Take 1 tablet (137 mcg total) by mouth daily before breakfast.  Dispense: 90 tablet; Refill: 1  3. Mixed  hyperlipidemia Mildly elevated LDL. Not on medication at this time, working on weight loss now.   4. Lower extremity edema Prn hydrochlorothiazide as prescribed  - hydrochlorothiazide (MICROZIDE) 12.5 MG capsule; Take 1 capsule (12.5 mg total) by mouth daily as needed (lower extremity swelling).  Dispense: 90 capsule; Refill: 1  5. B12 deficiency B12 injection administered in office today.  - cyanocobalamin (VITAMIN B12) injection 1,000 mcg  6. Vitamin D deficiency Continue weekly vitamin D supplement as prescribed.  - Vitamin D, Ergocalciferol, (DRISDOL) 1.25 MG (50000 UNIT) CAPS capsule; Take 1 capsule (50,000 Units total) by mouth every 7 (seven) days.  Dispense: 12 capsule; Refill: 1  7. Morbid obesity with body mass index of 60.0-69.9 in adult [facility]) Continue diethylpropion as prescribed,  follow up in 12 weeks  - Diethylpropion HCl CR 75 MG TB24; Take 1 tablet (75 mg total) by mouth daily before breakfast.  Dispense: 30 tablet; Refill: 2   General Counseling: Khadim verbalizes understanding of the findings of todays visit and agrees with plan of treatment. I have discussed any further diagnostic evaluation that may be needed or ordered today. We also reviewed his medications today. he has been encouraged to call the office with any questions or concerns that should arise related to todays visit.    No orders of the defined types were placed in this encounter.   Meds ordered this encounter  Medications   Vitamin D, Ergocalciferol, (DRISDOL) 1.25 MG (50000 UNIT) CAPS capsule    Sig: Take 1 capsule (50,000 Units total) by mouth every 7 (seven) days.    Dispense:  12 capsule    Refill:  1   levothyroxine (SYNTHROID) 137 MCG tablet    Sig: Take 1 tablet (137 mcg total) by mouth daily before breakfast.    Dispense:  90 tablet    Refill:  1    Fill new script today, discontinue 125 mcg dose.   Diethylpropion HCl CR 75 MG TB24    Sig: Take 1 tablet (75 mg total) by mouth daily  before breakfast.    Dispense:  30 tablet    Refill:  2    Do not run through insurance, patient will bring good rx coupon   cyanocobalamin (VITAMIN B12) injection 1,000 mcg   hydrochlorothiazide (MICROZIDE) 12.5 MG capsule    Sig: Take 1 capsule (12.5 mg total) by mouth daily as needed (lower extremity swelling).    Dispense:  90 capsule    Refill:  1    Return in about 3 months (around 08/16/2023) for F/U, Weight loss, eval new med, Niall Illes PCP also hypothyroidism. .   Total time spent:30 Minutes Time spent includes review of chart, medications, test results, and follow up plan with the patient.   [place] Controlled Substance Database was reviewed by me.  This patient was seen by Sallyanne Kuster, FNP-C in collaboration with Dr. Beverely Risen as a part of collaborative care agreement.   Gjon Letarte R. Tedd Sias, MSN, FNP-C Internal medicine

## 2023-06-03 ENCOUNTER — Encounter: Payer: Self-pay | Admitting: Nurse Practitioner

## 2023-08-24 ENCOUNTER — Ambulatory Visit: Payer: 59 | Admitting: Nurse Practitioner

## 2023-10-27 ENCOUNTER — Encounter: Payer: Self-pay | Admitting: Nurse Practitioner

## 2023-10-27 ENCOUNTER — Ambulatory Visit: Admitting: Nurse Practitioner

## 2023-10-27 VITALS — BP 120/70 | HR 76 | Temp 98.1°F | Resp 16 | Ht 66.0 in | Wt >= 6400 oz

## 2023-10-27 DIAGNOSIS — I10 Essential (primary) hypertension: Secondary | ICD-10-CM | POA: Diagnosis not present

## 2023-10-27 DIAGNOSIS — R600 Localized edema: Secondary | ICD-10-CM

## 2023-10-27 DIAGNOSIS — Z6841 Body Mass Index (BMI) 40.0 and over, adult: Secondary | ICD-10-CM

## 2023-10-27 DIAGNOSIS — E559 Vitamin D deficiency, unspecified: Secondary | ICD-10-CM

## 2023-10-27 DIAGNOSIS — E039 Hypothyroidism, unspecified: Secondary | ICD-10-CM | POA: Diagnosis not present

## 2023-10-27 DIAGNOSIS — Z9189 Other specified personal risk factors, not elsewhere classified: Secondary | ICD-10-CM

## 2023-10-27 MED ORDER — LEVOTHYROXINE SODIUM 137 MCG PO TABS: 137.0000 ug | ORAL_TABLET | Freq: Every day | ORAL | 1 refills | Status: AC

## 2023-10-27 MED ORDER — HYDROCHLOROTHIAZIDE 12.5 MG PO CAPS: 12.5000 mg | ORAL_CAPSULE | Freq: Every day | ORAL | 1 refills | Status: AC | PRN

## 2023-10-27 MED ORDER — VITAMIN D (ERGOCALCIFEROL) 1.25 MG (50000 UNIT) PO CAPS: 50000.0000 [IU] | ORAL_CAPSULE | ORAL | 1 refills | Status: AC

## 2023-10-27 MED ORDER — AMOXICILLIN-POT CLAVULANATE 875-125 MG PO TABS: 1.0000 | ORAL_TABLET | Freq: Two times a day (BID) | ORAL | 2 refills | Status: AC

## 2023-10-27 MED ORDER — DIETHYLPROPION HCL ER 75 MG PO TB24
75.0000 mg | ORAL_TABLET | Freq: Every day | ORAL | 2 refills | Status: AC
Start: 2023-10-27 — End: ?

## 2023-10-27 MED ORDER — LISINOPRIL 10 MG PO TABS: 10.0000 mg | ORAL_TABLET | Freq: Every day | ORAL | 3 refills | Status: AC

## 2023-10-27 NOTE — Progress Notes (Signed)
 [facility] [address] [place], [place] [address]  Internal MEDICINE  Office Visit Note  Patient Name: Isaiah Douglas  [id number]  [id number]  Date of Service: 10/27/2023  Chief Complaint  Patient presents with   Follow-up    HPI Jaelynn presents for a follow-up visit for weight loss, low vitamin D , hypertension, lymphedema, and hypothyroidism.  Weight loss -- down 13 lbs from previous visit. Has been taking dietheylpropion but ran out. Wants to continue this medication Low vitamin D  -- takes weekly supplement Hypertension --stable, on meds  Hypothyroidism -- takes levothyroxine   Lymphedema with cellulitis flare up recently, needs augmentin  refills.     Current Medication: Outpatient Encounter Medications as of 10/27/2023  Medication Sig   amoxicillin -clavulanate (AUGMENTIN ) 875-125 MG tablet Take 1 tablet by mouth 2 (two) times daily.   aspirin EC 81 MG tablet Take 81 mg by mouth daily.   Diethylpropion  HCl CR 75 MG TB24 Take 1 tablet (75 mg total) by mouth daily before breakfast.   hydrochlorothiazide  (MICROZIDE ) 12.5 MG capsule Take 1 capsule (12.5 mg total) by mouth daily as needed (lower extremity swelling).   levothyroxine  (SYNTHROID ) 137 MCG tablet Take 1 tablet (137 mcg total) by mouth daily before breakfast.   lisinopril  (ZESTRIL ) 10 MG tablet Take 1 tablet (10 mg total) by mouth daily.   Vitamin D , Ergocalciferol , (DRISDOL ) 1.25 MG (50000 UNIT) CAPS capsule Take 1 capsule (50,000 Units total) by mouth every 7 (seven) days.   [DISCONTINUED] Diethylpropion  HCl CR 75 MG TB24 Take 1 tablet (75 mg total) by mouth daily before breakfast.   [DISCONTINUED] hydrochlorothiazide  (MICROZIDE ) 12.5 MG capsule Take 1 capsule (12.5 mg total) by mouth daily as needed (lower extremity swelling).   [DISCONTINUED] levothyroxine  (SYNTHROID ) 137 MCG tablet Take 1 tablet (137 mcg total) by mouth daily before breakfast.   [DISCONTINUED] lisinopril  (ZESTRIL ) 10 MG tablet Take 1 tablet  (10 mg total) by mouth daily.   [DISCONTINUED] Vitamin D , Ergocalciferol , (DRISDOL ) 1.25 MG (50000 UNIT) CAPS capsule Take 1 capsule (50,000 Units total) by mouth every 7 (seven) days.   No facility-administered encounter medications on file as of 10/27/2023.    Surgical History: Past Surgical History:  Procedure Laterality Date   CHOLECYSTECTOMY, LAPAROSCOPIC     GASTRIC BYPASS     HERNIA REPAIR      Medical History: Past Medical History:  Diagnosis Date   Hypothyroidism    Lymphedema    Morbid obesity due to excess calories (HCC)     Family History: Family History  Problem Relation Age of Onset   Heart disease Mother    Hyperlipidemia Mother    Diabetes Mother    Hypertension Mother     Social History   Socioeconomic History   Marital status: Married    Spouse name: Not on file   Number of children: Not on file   Years of education: Not on file   Highest education level: Not on file  Occupational History   Not on file  Tobacco Use   Smoking status: Never   Smokeless tobacco: Never  Substance and Sexual Activity   Alcohol use: Yes    Comment: occational   Drug use: Never   Sexual activity: Not on file  Other Topics Concern   Not on file  Social History Narrative   Not on file   Social Drivers of Health   Financial Resource Strain: Low Risk  (12/10/2019)   Received from [facility]   Overall Financial Resource Strain (  CARDIA)    Difficulty of Paying Living Expenses: Not very hard  Food Insecurity: No Food Insecurity (12/10/2019)   Received from [facility]   Hunger Vital Sign    Within the past 12 months, you worried that your food would run out before you got the money to buy more.: Never true    Within the past 12 months, the food you bought just didn't last and you didn't have money to get more.: Never true  Transportation Needs: No Transportation Needs (12/10/2019)   Received from [facility]   [facility] - Transportation    Lack of  Transportation (Medical): No    Lack of Transportation (Non-Medical): No  Physical Activity: Not on file  Stress: Not on file  Social Connections: Not on file  Intimate Partner Violence: Not on file      Review of Systems  Constitutional:  Negative for chills, fatigue and unexpected weight change.  HENT:  Negative for congestion, postnasal drip, rhinorrhea, sneezing and sore throat.   Eyes:  Negative for redness.  Respiratory:  Negative for cough, chest tightness, shortness of breath and wheezing.   Cardiovascular:  Positive for leg swelling. Negative for chest pain and palpitations.  Gastrointestinal:  Negative for abdominal pain, constipation, diarrhea, nausea and vomiting.  Genitourinary:  Negative for dysuria and frequency.  Musculoskeletal:  Negative for arthralgias, back pain, joint swelling and neck pain.  Skin:  Negative for rash.  Neurological: Negative.  Negative for tremors and numbness.  Hematological:  Negative for adenopathy. Does not bruise/bleed easily.  Psychiatric/Behavioral:  Negative for behavioral problems (Depression), sleep disturbance and suicidal ideas. The patient is not nervous/anxious.     Vital Signs: BP 120/70   Pulse 76   Temp 98.1 F (36.7 C)   Resp 16   Ht 5' 6 (1.676 m)   Wt (!) 410 lb 9.6 oz (186.2 kg)   SpO2 96%   BMI 66.27 kg/m    Physical Exam Vitals reviewed.  Constitutional:      General: He is not in acute distress.    Appearance: Normal appearance. He is obese. He is not ill-appearing.  HENT:     Head: Normocephalic and atraumatic.  Eyes:     Pupils: Pupils are equal, round, and reactive to light.  Cardiovascular:     Rate and Rhythm: Normal rate and regular rhythm.  Pulmonary:     Effort: Pulmonary effort is normal. No respiratory distress.  Neurological:     Mental Status: He is alert and oriented to person, place, and time.  Psychiatric:        Mood and Affect: Mood normal.        Behavior: Behavior normal.         Assessment/Plan: 1. Essential hypertension (Primary) Stable, continue lisinopril  as prescribed.  - lisinopril  (ZESTRIL ) 10 MG tablet; Take 1 tablet (10 mg total) by mouth daily.  Dispense: 90 tablet; Refill: 3  2. Acquired hypothyroidism Continue levothyroxine  as prescribed  - levothyroxine  (SYNTHROID ) 137 MCG tablet; Take 1 tablet (137 mcg total) by mouth daily before breakfast.  Dispense: 90 tablet; Refill: 1  3. Lower extremity edema Continue hydrochlorothiazide   as needed - hydrochlorothiazide  (MICROZIDE ) 12.5 MG capsule; Take 1 capsule (12.5 mg total) by mouth daily as needed (lower extremity swelling).  Dispense: 90 capsule; Refill: 1  4. Vitamin D  deficiency Continue weekly supplement  - Vitamin D , Ergocalciferol , (DRISDOL ) 1.25 MG (50000 UNIT) CAPS capsule; Take 1 capsule (50,000 Units total) by mouth every 7 (  seven) days.  Dispense: 12 capsule; Refill: 1  5. Morbid obesity with body mass index of 60.0-69.9 in adult [facility]) Continue diethylpropion  as prescribed follow up in 3 months  - Diethylpropion  HCl CR 75 MG TB24; Take 1 tablet (75 mg total) by mouth daily before breakfast.  Dispense: 30 tablet; Refill: 2  6. At high risk for infection Antibiotic prescribed for cellulitis and lymphedema flares - amoxicillin -clavulanate (AUGMENTIN ) 875-125 MG tablet; Take 1 tablet by mouth 2 (two) times daily.  Dispense: 28 tablet; Refill: 2   General Counseling: Atwell verbalizes understanding of the findings of todays visit and agrees with plan of treatment. I have discussed any further diagnostic evaluation that may be needed or ordered today. We also reviewed his medications today. he has been encouraged to call the office with any questions or concerns that should arise related to todays visit.    No orders of the defined types were placed in this encounter.   Meds ordered this encounter  Medications   Diethylpropion  HCl CR 75 MG TB24    Sig: Take 1 tablet (75 mg total)  by mouth daily before breakfast.    Dispense:  30 tablet    Refill:  2    Do not run through insurance, patient will bring good rx coupon   amoxicillin -clavulanate (AUGMENTIN ) 875-125 MG tablet    Sig: Take 1 tablet by mouth 2 (two) times daily.    Dispense:  28 tablet    Refill:  2   Vitamin D , Ergocalciferol , (DRISDOL ) 1.25 MG (50000 UNIT) CAPS capsule    Sig: Take 1 capsule (50,000 Units total) by mouth every 7 (seven) days.    Dispense:  12 capsule    Refill:  1   levothyroxine  (SYNTHROID ) 137 MCG tablet    Sig: Take 1 tablet (137 mcg total) by mouth daily before breakfast.    Dispense:  90 tablet    Refill:  1    Fill new script today, discontinue 125 mcg dose.   hydrochlorothiazide  (MICROZIDE ) 12.5 MG capsule    Sig: Take 1 capsule (12.5 mg total) by mouth daily as needed (lower extremity swelling).    Dispense:  90 capsule    Refill:  1   lisinopril  (ZESTRIL ) 10 MG tablet    Sig: Take 1 tablet (10 mg total) by mouth daily.    Dispense:  90 tablet    Refill:  3    Pt need appt for further refills    Return in about 12 weeks (around 01/19/2024) for F/U, Beila Purdie PCP med refill diethylepropion.   Total time spent:30 Minutes Time spent includes review of chart, medications, test results, and follow up plan with the patient.   [place] Controlled Substance Database was reviewed by me.  This patient was seen by Mardy Maxin, FNP-C in collaboration with Dr. Sigrid Bathe as a part of collaborative care agreement.   Kendricks Reap R. Maxin, MSN, FNP-C Internal medicine

## 2024-01-25 ENCOUNTER — Ambulatory Visit: Admitting: Nurse Practitioner
